# Patient Record
Sex: Female | Born: 1965 | Race: White | Hispanic: No | Marital: Married | Smoking: Never smoker
Health system: Southern US, Community
[De-identification: ages and names within clinical notes are randomized; demographics above are authoritative.]

## PROBLEM LIST (undated history)

## (undated) DIAGNOSIS — I10 Essential (primary) hypertension: Secondary | ICD-10-CM

## (undated) DIAGNOSIS — E876 Hypokalemia: Secondary | ICD-10-CM

## (undated) HISTORY — PX: NO PAST SURGERIES: SHX2092

---

## 2014-11-02 ENCOUNTER — Ambulatory Visit: Payer: Self-pay | Admitting: Family Medicine

## 2018-04-09 ENCOUNTER — Emergency Department: Payer: 59

## 2018-04-09 ENCOUNTER — Other Ambulatory Visit: Payer: Self-pay

## 2018-04-09 ENCOUNTER — Observation Stay
Admission: EM | Admit: 2018-04-09 | Discharge: 2018-04-10 | Disposition: A | Discharge status: Home / Self Care | Payer: 59 | Attending: Internal Medicine | Admitting: Internal Medicine

## 2018-04-09 ENCOUNTER — Emergency Department
Admit: 2018-04-09 | Discharge: 2018-04-09 | Disposition: A | Discharge status: Home / Self Care | Payer: 59 | Attending: Cardiovascular Disease | Admitting: Cardiovascular Disease

## 2018-04-09 DIAGNOSIS — R002 Palpitations: Secondary | ICD-10-CM | POA: Diagnosis not present

## 2018-04-09 DIAGNOSIS — E876 Hypokalemia: Secondary | ICD-10-CM | POA: Diagnosis not present

## 2018-04-09 DIAGNOSIS — R9431 Abnormal electrocardiogram [ECG] [EKG]: Secondary | ICD-10-CM | POA: Diagnosis not present

## 2018-04-09 DIAGNOSIS — I10 Essential (primary) hypertension: Secondary | ICD-10-CM | POA: Diagnosis not present

## 2018-04-09 DIAGNOSIS — Z791 Long term (current) use of non-steroidal anti-inflammatories (NSAID): Secondary | ICD-10-CM | POA: Insufficient documentation

## 2018-04-09 DIAGNOSIS — R079 Chest pain, unspecified: Principal | ICD-10-CM | POA: Insufficient documentation

## 2018-04-09 LAB — CBC
HCT: 34.3 % — ABNORMAL LOW (ref 35.0–47.0)
Hemoglobin: 12.1 g/dL (ref 12.0–16.0)
MCH: 33.6 pg (ref 26.0–34.0)
MCHC: 35.4 g/dL (ref 32.0–36.0)
MCV: 94.9 fL (ref 80.0–100.0)
Platelets: 338 10*3/uL (ref 150–440)
RBC: 3.62 MIL/uL — ABNORMAL LOW (ref 3.80–5.20)
RDW: 12.7 % (ref 11.5–14.5)
WBC: 7.7 10*3/uL (ref 3.6–11.0)

## 2018-04-09 LAB — COMPREHENSIVE METABOLIC PANEL
ALBUMIN: 4.4 g/dL (ref 3.5–5.0)
ALK PHOS: 99 U/L (ref 38–126)
ALT: 31 U/L (ref 0–44)
ANION GAP: 16 — AB (ref 5–15)
AST: 42 U/L — ABNORMAL HIGH (ref 15–41)
BILIRUBIN TOTAL: 1 mg/dL (ref 0.3–1.2)
BUN: 7 mg/dL (ref 6–20)
CALCIUM: 9.5 mg/dL (ref 8.9–10.3)
CO2: 17 mmol/L — ABNORMAL LOW (ref 22–32)
Chloride: 105 mmol/L (ref 98–111)
Creatinine, Ser: 0.71 mg/dL (ref 0.44–1.00)
GLUCOSE: 100 mg/dL — AB (ref 70–99)
Potassium: 2.5 mmol/L — CL (ref 3.5–5.1)
Sodium: 138 mmol/L (ref 135–145)
TOTAL PROTEIN: 7.5 g/dL (ref 6.5–8.1)

## 2018-04-09 LAB — ECHOCARDIOGRAM COMPLETE
Height: 68 in
WEIGHTICAEL: 2416 [oz_av]

## 2018-04-09 LAB — TROPONIN I: Troponin I: <0.03 ng/mL (ref <0.03)

## 2018-04-09 LAB — POCT PREGNANCY, URINE: PREG TEST UR: NEGATIVE

## 2018-04-09 MED ORDER — TRAZODONE HCL 50 MG PO TABS: 25.0000 mg | ORAL_TABLET | Freq: Every evening | ORAL | Status: DC | PRN

## 2018-04-09 MED ORDER — ASPIRIN EC 81 MG PO TBEC: 81.0000 mg | DELAYED_RELEASE_TABLET | Freq: Every day | ORAL | Status: DC

## 2018-04-09 MED ORDER — ONDANSETRON HCL 4 MG PO TABS: 4.0000 mg | ORAL_TABLET | Freq: Four times a day (QID) | ORAL | Status: DC | PRN

## 2018-04-09 MED ORDER — LORAZEPAM 2 MG/ML IJ SOLN
INTRAMUSCULAR | Status: AC
  Administered 2018-04-09: 0.5 mg via INTRAVENOUS
  Filled 2018-04-09: qty 1

## 2018-04-09 MED ORDER — SODIUM CHLORIDE 0.9 % IV SOLN
1000.0000 mL | Freq: Once | INTRAVENOUS | Status: AC
  Administered 2018-04-09: 1000 mL via INTRAVENOUS

## 2018-04-09 MED ORDER — ONDANSETRON HCL 4 MG/2ML IJ SOLN: 4.0000 mg | Freq: Four times a day (QID) | INTRAMUSCULAR | Status: DC | PRN

## 2018-04-09 MED ORDER — DOCUSATE SODIUM 100 MG PO CAPS
100.0000 mg | ORAL_CAPSULE | Freq: Two times a day (BID) | ORAL | Status: DC
  Filled 2018-04-09: qty 1

## 2018-04-09 MED ORDER — BISACODYL 5 MG PO TBEC: 5.0000 mg | DELAYED_RELEASE_TABLET | Freq: Every day | ORAL | Status: DC | PRN

## 2018-04-09 MED ORDER — ACETAMINOPHEN 325 MG PO TABS: 650.0000 mg | ORAL_TABLET | Freq: Four times a day (QID) | ORAL | Status: DC | PRN

## 2018-04-09 MED ORDER — SODIUM CHLORIDE 0.9 % IV SOLN: INTRAVENOUS | Status: DC

## 2018-04-09 MED ORDER — ACETAMINOPHEN 650 MG RE SUPP: 650.0000 mg | Freq: Four times a day (QID) | RECTAL | Status: DC | PRN

## 2018-04-09 MED ORDER — HEPARIN SODIUM (PORCINE) 5000 UNIT/ML IJ SOLN
5000.0000 [IU] | Freq: Three times a day (TID) | INTRAMUSCULAR | Status: DC
  Administered 2018-04-09 – 2018-04-10 (×2): 5000 [IU] via SUBCUTANEOUS
  Filled 2018-04-09 (×2): qty 1

## 2018-04-09 MED ORDER — CLONIDINE HCL 0.1 MG PO TABS
0.1000 mg | ORAL_TABLET | Freq: Four times a day (QID) | ORAL | Status: DC | PRN
  Administered 2018-04-09: 0.1 mg via ORAL
  Filled 2018-04-09: qty 1

## 2018-04-09 MED ORDER — HYDROCODONE-ACETAMINOPHEN 5-325 MG PO TABS
1.0000 | ORAL_TABLET | ORAL | Status: DC | PRN
  Administered 2018-04-10: 1 via ORAL
  Filled 2018-04-09: qty 1

## 2018-04-09 MED ORDER — POTASSIUM CHLORIDE IN NACL 40-0.9 MEQ/L-% IV SOLN
INTRAVENOUS | Status: DC
  Administered 2018-04-09: 75 mL/h via INTRAVENOUS
  Filled 2018-04-09 (×3): qty 1000

## 2018-04-09 MED ORDER — POTASSIUM CHLORIDE 10 MEQ/100ML IV SOLN
10.0000 meq | INTRAVENOUS | Status: DC
  Administered 2018-04-09: 10 meq via INTRAVENOUS
  Filled 2018-04-09 (×4): qty 100

## 2018-04-09 MED ORDER — LORAZEPAM 2 MG/ML IJ SOLN
0.5000 mg | Freq: Once | INTRAMUSCULAR | Status: AC
  Administered 2018-04-09: 0.5 mg via INTRAVENOUS

## 2018-04-09 MED ORDER — LORAZEPAM 2 MG/ML IJ SOLN: 0.5000 mg | Freq: Four times a day (QID) | INTRAMUSCULAR | Status: DC | PRN

## 2018-04-09 NOTE — ED Notes (Signed)
Pt states she is experiencing numbness and tingling in her arms and hand. Pt currently had increased breathing rate and instructed to slow breathing and take long deep breaths.

## 2018-04-09 NOTE — ED Provider Notes (Signed)
[facility] Emergency Department Provider Note   ____________________________________________    I have reviewed the triage vital signs and the nursing notes.   HISTORY  Chief Complaint Chest Pain     HPI Carmen Salazar is a 52 y.o. female who presents with complaints of chest pain.  Patient reports chest pain started approximate 1 hour ago.  She describes it as a mild pressure-like pain, she feels quite anxious as well.  Denies radiation of pain.  She has not taken anything for this.  Does admit to using cocaine last night.  No fevers or chills or cough.  No calf pain or swelling.   No past medical history on file.  There are no active problems to display for this patient.     Prior to Admission medications   Medication Sig Start Date End Date Taking? Authorizing Provider  ibuprofen (ADVIL,MOTRIN) 200 MG tablet Take 200-400 mg by mouth every 6 (six) hours as needed for headache or moderate pain.   Yes [provider]     Allergies Patient has no known allergies.  No family history on file.  Social History Cocaine use last night Smoke cigarettes  Review of Systems  Constitutional: No fever/chills Eyes: No visual changes.  ENT: No sore throat. Cardiovascular: As above Respiratory: Denies shortness of breath. Gastrointestinal: No abdominal pain.  No nausea, no vomiting.   Genitourinary: Negative for dysuria. Musculoskeletal: Negative for back pain. Skin: Negative for rash. Neurological: Negative for headaches   ____________________________________________   PHYSICAL EXAM:  VITAL SIGNS: ED Triage Vitals [04/09/18 1733]  Enc Vitals Group     BP      Pulse      Resp      Temp 97.8 F (36.6 C)     Temp Source Oral     SpO2      Weight 68.5 kg (151 lb)     Height 1.727 m (5\' 8" )     Head Circumference      Peak Flow      Pain Score 10     Pain Loc      Pain Edu?      Excl. in GC?      Constitutional: Alert and oriented.  Eyes: Conjunctivae are normal.   Nose: No congestion/rhinnorhea. Mouth/Throat: Mucous membranes are moist.   Neck:  Painless ROM Cardiovascular: Tachycardia, regular rhythm. Grossly normal heart sounds.  Good peripheral circulation. Respiratory: Normal respiratory effort.  No retractions. Lungs CTAB. Gastrointestinal: Soft and nontender. No distention.  No CVA tenderness. Genitourinary: deferred Musculoskeletal: No lower extremity tenderness nor edema.  Warm and well perfused Neurologic:  Normal speech and language. No gross focal neurologic deficits are appreciated.  Skin:  Skin is warm, dry and intact. No rash noted. Psychiatric: Mood and affect are normal. Speech and behavior are normal.  ____________________________________________   LABS (all labs ordered are listed, but only abnormal results are displayed)  Labs Reviewed  CBC - Abnormal; Notable for the following components:      Result Value   RBC 3.62 (*)    HCT 34.3 (*)    All other components within normal limits  COMPREHENSIVE METABOLIC PANEL - Abnormal; Notable for the following components:   Potassium 2.5 (*)    CO2 17 (*)    Glucose, Bld 100 (*)    AST 42 (*)    Anion gap 16 (*)    All other components within normal limits  TROPONIN I  POC URINE  PREG, ED   ____________________________________________  EKG  ED ECG REPORT I, Jene Everyobert Thadius Smisek, the attending physician, personally viewed and interpreted this ECG.  Date: 04/09/2018 EKG Time: 5:35 PM Rate: 119 Rhythm: Sinus tachycardia QRS Axis: normal Intervals: normal ST/T Wave abnormalities:  Narrative Interpretation: ST elevation in aVR mild ST depression laterally and in 1 and 2 concerning for ischemia but does not meet STEMI criteria, will discuss with cardiology  ____________________________________________  RADIOLOGY  Chest x-ray  unremarkable ____________________________________________   PROCEDURES  Procedure(s) performed: No  Procedures   Critical Care performed: No ____________________________________________   INITIAL IMPRESSION / ASSESSMENT AND PLAN / ED COURSE  Pertinent labs & imaging results that were available during my care of the patient were reviewed by me and considered in my medical decision making (see chart for details).  ----------------------------------------- 5:51 PM on 04/09/2018 -----------------------------------------  Patient presents with chest pain, cocaine use last night.  EKG concerning for ischemia but does not meet STEMI criteria, will discuss with on-call cardiology  Dr. Welton FlakesKhan reports he will see the patient in the emergency department   Patient's troponin is normal, Dr. Welton FlakesKhan is ordering an echo and recommends admission to the hospital    ____________________________________________   FINAL CLINICAL IMPRESSION(S) / ED DIAGNOSES  Final diagnoses:  Chest pain, unspecified type        Note:  This document was prepared using Dragon voice recognition software and may include unintentional dictation errors.    Jene EveryKinner, Paco Cislo, MD 04/09/18 [phone]

## 2018-04-09 NOTE — ED Notes (Signed)
Pt drinking water, awaiting admission.

## 2018-04-09 NOTE — ED Notes (Signed)
Pt up to bathroom, ua sent to lab. Still co slight palpitations and some dizziness, denies any cp.

## 2018-04-09 NOTE — ED Notes (Signed)
Date and time results received: 04/09/18  Test: pot Critical Value: 2.5  Name of Provider Notified: kinner  Orders Received? Or Actions Taken?: MD notified

## 2018-04-09 NOTE — H&P (Signed)
[facility] [place] at [facility]   PATIENT NAME: Carmen Salazar    MR#:  [id number]  DATE OF BIRTH:  May 11, 1966  DATE OF ADMISSION:  04/09/2018  PRIMARY CARE PHYSICIAN: Leim Fabry, MD   REQUESTING/REFERRING PHYSICIAN:   CHIEF COMPLAINT:   Chief Complaint  Patient presents with  . Chest Pain    HISTORY OF PRESENT ILLNESS: Carmen Salazar  is a 52 y.o. female without significant past medical history. Patient presented to emergency room for acute onset of retrosternal chest pain associated with palpitations and anxiety. She describes the chest pain as a constant 6 out of 10 pressure, without radiation, started approximately 1 hour before arrival to emergency room.  No fever or chills, no cough, no N/V/D, no bleeding.  No new medications.  Patient does admit to using cocaine and alcohol last night. At the arrival to emergency room, patient was noted with high blood pressure and tachycardia.  EKG showed sinus tachycardia with a heart rate 119.  There is ST elevation in aVR and mild ST depression laterally in V1 and V2.  Troponin level is negative.  Potassium level is low at 2.5.  Chest x-ray is unremarkable. Patient is admitted as observation, to rule out ACS.  PAST MEDICAL HISTORY: Headaches.  PAST SURGICAL HISTORY: No surgeries.  SOCIAL HISTORY:  No tobacco use, but patient admits to using alcohol and cocaine most recent, last night.  FAMILY HISTORY: HTN in mother.  DRUG ALLERGIES: No Known Allergies  REVIEW OF SYSTEMS:   CONSTITUTIONAL: No fever, fatigue or weakness.  EYES: No blurred or double vision.  EARS, NOSE, AND THROAT: No tinnitus or ear pain.  RESPIRATORY: No cough, shortness of breath, wheezing or hemoptysis.  CARDIOVASCULAR: Positive for chest pain and palpitations.  GASTROINTESTINAL: No nausea, vomiting, diarrhea or abdominal pain.  GENITOURINARY: No dysuria, hematuria.  ENDOCRINE: No polyuria, nocturia,  HEMATOLOGY: No anemia,  easy bruising or bleeding SKIN: No rash or lesion. MUSCULOSKELETAL: No joint pain or arthritis.   NEUROLOGIC: No tingling, numbness, weakness.  PSYCHIATRY: No anxiety or depression.   MEDICATIONS AT HOME:  Prior to Admission medications   Medication Sig Start Date End Date Taking? Authorizing Provider  ibuprofen (ADVIL,MOTRIN) 200 MG tablet Take 200-400 mg by mouth every 6 (six) hours as needed for headache or moderate pain.   Yes [provider]      PHYSICAL EXAMINATION:   VITAL SIGNS: Blood pressure (!) 162/93, pulse 98, temperature 97.8 F (36.6 C), temperature source Oral, resp. rate 17, height 5\' 8"  (1.727 m), weight 68.5 kg (151 lb), SpO2 98 %.  GENERAL:  52 y.o.-year-old patient lying in the bed with no acute distress.  EYES: Pupils equal, round, reactive to light and accommodation. No scleral icterus. Extraocular muscles intact.  HEENT: Head atraumatic, normocephalic. Oropharynx and nasopharynx clear.  NECK:  Supple, no jugular venous distention. No thyroid enlargement, no tenderness.  LUNGS: Normal breath sounds bilaterally, no wheezing, rales,rhonchi or crepitation. No use of accessory muscles of respiration.  CARDIOVASCULAR: Positive for sinus tachycardia.  S1, S2 normal.  No S3/S4.  ABDOMEN: Soft, nontender, nondistended. Bowel sounds present. No organomegaly or mass.  EXTREMITIES: No pedal edema, cyanosis, or clubbing.  NEUROLOGIC: Cranial nerves II through XII are intact. Muscle strength 5/5 in all extremities. Sensation intact. Gait is stable.   PSYCHIATRIC: The patient is alert and oriented x 3.  SKIN: No obvious rash, lesion, or ulcer.   LABORATORY PANEL:   CBC Recent Labs  Lab 04/09/18 1736  WBC 7.7  HGB 12.1  HCT 34.3*  PLT 338  MCV 94.9  MCH 33.6  MCHC 35.4  RDW 12.7   ------------------------------------------------------------------------------------------------------------------  Chemistries  Recent Labs  Lab 04/09/18 1736  NA 138   K 2.5*  CL 105  CO2 17*  GLUCOSE 100*  BUN 7  CREATININE 0.71  CALCIUM 9.5  AST 42*  ALT 31  ALKPHOS 99  BILITOT 1.0   ------------------------------------------------------------------------------------------------------------------ estimated creatinine clearance is 83 mL/min (by C-G formula based on SCr of 0.71 mg/dL). ------------------------------------------------------------------------------------------------------------------ No results for input(s): TSH, T4TOTAL, T3FREE, THYROIDAB in the last 72 hours.  Invalid input(s): FREET3   Coagulation profile No results for input(s): INR, PROTIME in the last 168 hours. ------------------------------------------------------------------------------------------------------------------- No results for input(s): DDIMER in the last 72 hours. -------------------------------------------------------------------------------------------------------------------  Cardiac Enzymes Recent Labs  Lab 04/09/18 1736  TROPONINI <0.03   ------------------------------------------------------------------------------------------------------------------ Invalid input(s): POCBNP  ---------------------------------------------------------------------------------------------------------------  Urinalysis No results found for: COLORURINE, APPEARANCEUR, LABSPEC, PHURINE, GLUCOSEU, HGBUR, BILIRUBINUR, KETONESUR, PROTEINUR, UROBILINOGEN, NITRITE, LEUKOCYTESUR   RADIOLOGY: Dg Chest Port 1 View  Result Date: 04/09/2018 CLINICAL DATA:  Chest pain EXAM: PORTABLE CHEST 1 VIEW COMPARISON:  None. FINDINGS: The heart size and mediastinal contours are within normal limits. Both lungs are clear. Mild scoliosis of the spine. IMPRESSION: No active disease. Electronically Signed   By: Jasmine PangKim  Fujinaga M.D.   On: 04/09/2018 18:00    EKG: Orders placed or performed during the hospital encounter of 04/09/18  . ED EKG within 10 minutes  . EKG 12-Lead  . EKG 12-Lead  .  EKG 12-Lead  . EKG 12-Lead  . ED EKG within 10 minutes    IMPRESSION AND PLAN:  1.  Chest pain, associated with palpitations and EKG abnormalities, likely secondary to cocaine use.  Will rule out ACS. Will check 2D echo and repeat troponin level.  Continue to monitor on telemetry.  Cardiology is consulted for further evaluation and treatment. 2.  Hypertension, likely secondary to cocaine use.  Will treat with clonidine as needed. 3.  Alcohol and cocaine use. ETOH and cocaine cessation was discussed with patient in detail. 4.  Hypokalemia.  Will replace potassium per protocol.    All the records are reviewed and case discussed with ED provider. Management plans discussed with the patient, family and they are in agreement.  CODE STATUS: Full   TOTAL TIME TAKING CARE OF THIS PATIENT: 45 minutes.    Cammy CopaAngela Zykiria Bruening M.D on 04/09/2018 at 9:53 PM  Between 7am to 6pm - Pager - [phone]  After 6pm go to [email or web address] - password [email or web address] [email or web address]  [facility]  Office  [phone]  CC: Primary care physician; Leim FabryAldridge, Barbara, MD

## 2018-04-09 NOTE — Consult Note (Signed)
Carmen Salazar is a 52 y.o. female  [id number]  Primary Cardiologist: Adrian Blackwatershaukat Neal Trulson Reason for Consultation: chest pain  HPI: [id number] came in by EMS with severe left sided chest pain as radiating to arm and back associated with palpitation and SOB.   Review of Systems: NO syncope   No past medical history on file.   (Not in a hospital admission)     Infusions:   No Known Allergies  Social History   Socioeconomic History  . Marital status: Married    Spouse name: Not on file  . Number of children: Not on file  . Years of education: Not on file  . Highest education level: Not on file  Occupational History  . Not on file  Social Needs  . Financial resource strain: Not on file  . Food insecurity:    Worry: Not on file    Inability: Not on file  . Transportation needs:    Medical: Not on file    Non-medical: Not on file  Tobacco Use  . Smoking status: Not on file  Substance and Sexual Activity  . Alcohol use: Not on file  . Drug use: Not on file  . Sexual activity: Not on file  Lifestyle  . Physical activity:    Days per week: Not on file    Minutes per session: Not on file  . Stress: Not on file  Relationships  . Social connections:    Talks on phone: Not on file    Gets together: Not on file    Attends religious service: Not on file    Active member of club or organization: Not on file    Attends meetings of clubs or organizations: Not on file    Relationship status: Not on file  . Intimate partner violence:    Fear of current or ex partner: Not on file    Emotionally abused: Not on file    Physically abused: Not on file    Forced sexual activity: Not on file  Other Topics Concern  . Not on file  Social History Narrative  . Not on file    No family history on file.  PHYSICAL EXAM: Vitals:   04/09/18 1830 04/09/18 1900  BP: (!) 154/93 (!) 155/92  Pulse: (!) 101 (!) 102  Resp: 19 (!) 21  Temp:    SpO2: 100% 99%    No intake  or output data in the 24 hours ending 04/09/18 1938  General:  Well appearing. No respiratory difficulty HEENT: normal Neck: supple. no JVD. Carotids 2+ bilat; no bruits. No lymphadenopathy or thryomegaly appreciated. Cor: PMI nondisplaced. Regular rate & rhythm. No rubs, gallops or murmurs. Lungs: clear Abdomen: soft, nontender, nondistended. No hepatosplenomegaly. No bruits or masses. Good bowel sounds. Extremities: no cyanosis, clubbing, rash, edema Neuro: alert & oriented x 3, cranial nerves grossly intact. moves all 4 extremities w/o difficulty. Affect pleasant.  ECG: sinus tachycardia with inferolateral st depressions  Results for orders placed or performed during the hospital encounter of 04/09/18 (from the past 24 hour(s))  CBC     Status: Abnormal   Collection Time: 04/09/18  5:36 PM  Result Value Ref Range   WBC 7.7 3.6 - 11.0 K/uL   RBC 3.62 (L) 3.80 - 5.20 MIL/uL   Hemoglobin 12.1 12.0 - 16.0 g/dL   HCT 40.934.3 (L) 81.135.0 - 91.447.0 %   MCV 94.9 80.0 - 100.0 fL   MCH 33.6 26.0 - 34.0 pg  MCHC 35.4 32.0 - 36.0 g/dL   RDW 30.8 65.7 - 84.6 %   Platelets 338 150 - 440 K/uL  Troponin I     Status: None   Collection Time: 04/09/18  5:36 PM  Result Value Ref Range   Troponin I <0.03 <0.03 ng/mL  Comprehensive metabolic panel     Status: Abnormal   Collection Time: 04/09/18  5:36 PM  Result Value Ref Range   Sodium 138 135 - 145 mmol/L   Potassium 2.5 (LL) 3.5 - 5.1 mmol/L   Chloride 105 98 - 111 mmol/L   CO2 17 (L) 22 - 32 mmol/L   Glucose, Bld 100 (H) 70 - 99 mg/dL   BUN 7 6 - 20 mg/dL   Creatinine, Ser 9.62 0.44 - 1.00 mg/dL   Calcium 9.5 8.9 - 95.2 mg/dL   Total Protein 7.5 6.5 - 8.1 g/dL   Albumin 4.4 3.5 - 5.0 g/dL   AST 42 (H) 15 - 41 U/L   ALT 31 0 - 44 U/L   Alkaline Phosphatase 99 38 - 126 U/L   Total Bilirubin 1.0 0.3 - 1.2 mg/dL   GFR calc non Af Amer >60 >60 mL/min   GFR calc Af Amer >60 >60 mL/min   Anion gap 16 (H) 5 - 15   Dg Chest Port 1 View  Result  Date: 04/09/2018 CLINICAL DATA:  Chest pain EXAM: PORTABLE CHEST 1 VIEW COMPARISON:  None. FINDINGS: The heart size and mediastinal contours are within normal limits. Both lungs are clear. Mild scoliosis of the spine. IMPRESSION: No active disease. Electronically Signed   By: Jasmine Pang M.D.   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 Has also Hypokalemia, and advise IV 40 meq over 4 hours. Also advise nitrates topically, asp, heparin and sedatives. Advise cardizem for tachycardia as metoprolol not ideal for coronary spasms. May need echo to look at wall motion.  Darya Bigler A

## 2018-04-09 NOTE — Progress Notes (Addendum)
Pharmacy Electrolyte Monitoring Consult:  Pharmacy consulted to assist in monitoring and replacing electrolytes in this 52 y.o. female admitted on 04/09/2018 with Chest Pain   Labs:  Sodium (mmol/L)  Date Value  04/09/2018 138   Potassium (mmol/L)  Date Value  04/09/2018 2.5 (LL)   Calcium (mg/dL)  Date Value  84/13/244007/12/2017 9.5   Albumin (g/dL)  Date Value  10/27/253607/12/2017 4.4    Assessment/Plan: Patient admitted for CP w/ trop NG, EKG sinus tach. On admission labs K+ 2.5 Will replace via fluids NS + 40 mEq IV @ 75 ml/hr in addition to KCI 10 mEq IV x 4. Should theoretical increase K+ to 3.6 after 24 hours. Will monitor w/ am labs, mag pending  Patient's IV is burning, will discontinue K runs and switch to PO 40 mEq bid for 3 doses and continue fluids.  Thomasene Rippleavid Zykerria Tanton, PharmD, BCPS Clinical Pharmacist 04/09/2018

## 2018-04-09 NOTE — ED Triage Notes (Signed)
Pt presents today via [facility] from home around 445 walking daog felt palputations and pressure in chest. PT complains of shob 145 HR vagal down to 125 NKA use cocaine last night and drank alcohol. [id number]-124 97.8 oral temp 147/87 HR 121 99 2L

## 2018-04-09 NOTE — Progress Notes (Signed)
*  PRELIMINARY RESULTS* Echocardiogram 2D Echocardiogram has been performed.  Garrel Ridgelikeshia S Florinda Taflinger 04/09/2018, 8:01 PM

## 2018-04-09 NOTE — ED Notes (Signed)
Pt admitted to 233, report called to RN

## 2018-04-09 NOTE — ED Notes (Signed)
Pt resting comfortably with family at bedside. Pt denies any pain at this time still co slight feeling of racing heart rate. IV x 2 noted with no redness or swelling noted. Pt awaiting disposition, call bell at bedside.

## 2018-04-10 ENCOUNTER — Other Ambulatory Visit: Payer: Self-pay

## 2018-04-10 LAB — URINE DRUG SCREEN, QUALITATIVE (ARMC ONLY)
AMPHETAMINES, UR SCREEN: POSITIVE — AB
Benzodiazepine, Ur Scrn: POSITIVE — AB
COCAINE METABOLITE, UR ~~LOC~~: POSITIVE — AB
Cannabinoid 50 Ng, Ur ~~LOC~~: NOT DETECTED
MDMA (Ecstasy)Ur Screen: NOT DETECTED
METHADONE SCREEN, URINE: NOT DETECTED
OPIATE, UR SCREEN: NOT DETECTED
Phencyclidine (PCP) Ur S: NOT DETECTED
TRICYCLIC, UR SCREEN: NOT DETECTED

## 2018-04-10 LAB — CBC
HEMATOCRIT: 33.8 % — AB (ref 35.0–47.0)
HEMOGLOBIN: 11.8 g/dL — AB (ref 12.0–16.0)
MCH: 33.5 pg (ref 26.0–34.0)
MCHC: 34.9 g/dL (ref 32.0–36.0)
MCV: 95.9 fL (ref 80.0–100.0)
Platelets: 293 10*3/uL (ref 150–440)
RBC: 3.53 MIL/uL — AB (ref 3.80–5.20)
RDW: 12.7 % (ref 11.5–14.5)
WBC: 6 10*3/uL (ref 3.6–11.0)

## 2018-04-10 LAB — BASIC METABOLIC PANEL
ANION GAP: 7 (ref 5–15)
BUN: 5 mg/dL — ABNORMAL LOW (ref 6–20)
CO2: 24 mmol/L (ref 22–32)
Calcium: 8.9 mg/dL (ref 8.9–10.3)
Chloride: 111 mmol/L (ref 98–111)
Creatinine, Ser: 0.54 mg/dL (ref 0.44–1.00)
GFR calc Af Amer: >60 mL/min (ref >60)
GFR calc non Af Amer: >60 mL/min (ref >60)
GLUCOSE: 84 mg/dL (ref 70–99)
POTASSIUM: 4.3 mmol/L (ref 3.5–5.1)
Sodium: 142 mmol/L (ref 135–145)

## 2018-04-10 LAB — MAGNESIUM: Magnesium: 1.7 mg/dL (ref 1.7–2.4)

## 2018-04-10 LAB — GLUCOSE, CAPILLARY: GLUCOSE-CAPILLARY: 78 mg/dL (ref 70–99)

## 2018-04-10 LAB — TROPONIN I: Troponin I: <0.03 ng/mL (ref <0.03)

## 2018-04-10 MED ORDER — POTASSIUM CHLORIDE CRYS ER 20 MEQ PO TBCR
40.0000 meq | EXTENDED_RELEASE_TABLET | Freq: Two times a day (BID) | ORAL | Status: DC
  Administered 2018-04-10: 40 meq via ORAL
  Filled 2018-04-10: qty 2

## 2018-04-10 MED ORDER — ISOSORBIDE MONONITRATE ER 30 MG PO TB24: 30.0000 mg | ORAL_TABLET | Freq: Every day | ORAL | 0 refills | Status: DC

## 2018-04-10 MED ORDER — ASPIRIN 81 MG PO TBEC: 81.0000 mg | DELAYED_RELEASE_TABLET | Freq: Every day | ORAL | 0 refills | Status: DC

## 2018-04-10 MED ORDER — SODIUM CHLORIDE 0.9% FLUSH: 3.0000 mL | Freq: Two times a day (BID) | INTRAVENOUS | Status: DC

## 2018-04-10 MED ORDER — ISOSORBIDE MONONITRATE ER 30 MG PO TB24: 30.0000 mg | ORAL_TABLET | Freq: Every day | ORAL | Status: DC

## 2018-04-10 MED ORDER — MAGNESIUM OXIDE -MG SUPPLEMENT 200 MG PO TABS: 200.0000 mg | ORAL_TABLET | Freq: Every morning | ORAL | 0 refills | Status: AC

## 2018-04-10 MED ORDER — MAGNESIUM SULFATE 2 GM/50ML IV SOLN
2.0000 g | INTRAVENOUS | Status: AC
  Administered 2018-04-10: 2 g via INTRAVENOUS
  Filled 2018-04-10: qty 50

## 2018-04-10 NOTE — Plan of Care (Signed)
  Problem: Education: Goal: Knowledge of General Education information will improve Outcome: Progressing   Problem: Health Behavior/Discharge Planning: Goal: Ability to manage health-related needs will improve Outcome: Progressing   Problem: Pain Managment: Goal: General experience of comfort will improve Outcome: Progressing   

## 2018-04-10 NOTE — Discharge Summary (Signed)
[facility] - [place] at [facility]   PATIENT NAME: Carmen Salazar    MR#:  [id number]  DATE OF BIRTH:  [birth year]  DATE OF ADMISSION:  04/09/2018 ADMITTING PHYSICIAN: Cammy Copa, MD  DATE OF DISCHARGE: 04/10/2018 11:56 AM  PRIMARY CARE PHYSICIAN: Leim Fabry, MD    ADMISSION DIAGNOSIS:  Chest pain, unspecified type [R07.9]  DISCHARGE DIAGNOSIS:  Active Problems:   Chest pain   SECONDARY DIAGNOSIS:  No past medical history on file.  HOSPITAL COURSE:   1.  Chest pain and palpitations.  Patient used cocaine on Tuesday.  This is likely coronary vasospasm secondary to cocaine use.  Patient was seen in consultation by Dr. Park Breed cardiology and he will follow-up with the patient as outpatient on Monday. He recommended aspirin, Imdur.   2.  Hypokalemia and hypomagnesemia.  Replace magnesium oral and IV.  Replace potassium orally and IV.  On discharge her potassium is in the normal range.  We will continue magnesium supplementation  orally as outpatient. 3.  Cocaine use.  Patient was advised to stop using cocaine because it can cause coronary vasospasm and sudden death. 4.  Alcohol use.  Patient advised to cut back on alcohol secondary to electrolyte abnormalities. 5.  Elevated blood pressure likely secondary to cocaine.  Continue to monitor as outpatient.   DISCHARGE CONDITIONS:   Satisfactory  CONSULTS OBTAINED:  Treatment Team:  Laurier Nancy, MD  DRUG ALLERGIES:  No Known Allergies  DISCHARGE MEDICATIONS:   Allergies as of 04/10/2018   No Known Allergies     Medication List    STOP taking these medications   ibuprofen 200 MG tablet Commonly known as:  ADVIL,MOTRIN     TAKE these medications   aspirin 81 MG EC tablet Take 1 tablet (81 mg total) by mouth daily.   isosorbide mononitrate 30 MG 24 hr tablet Commonly known as:  IMDUR Take 1 tablet (30 mg total) by mouth daily.   Magnesium Oxide 200 MG Tabs Commonly known as:  MAG-OXIDE Take  1 tablet (200 mg total) by mouth every morning.        DISCHARGE INSTRUCTIONS:    Follow-up with Dr. Park Breed cardiology on Monday Follow-up with PMD 6 days  If you experience worsening of your admission symptoms, develop shortness of breath, life threatening emergency, suicidal or homicidal thoughts you must seek medical attention immediately by calling 911 or calling your MD immediately  if symptoms less severe.  You Must read complete instructions/literature along with all the possible adverse reactions/side effects for all the Medicines you take and that have been prescribed to you. Take any new Medicines after you have completely understood and accept all the possible adverse reactions/side effects.   Please note  You were cared for by a hospitalist during your hospital stay. If you have any questions about your discharge medications or the care you received while you were in the hospital after you are discharged, you can call the unit and asked to speak with the hospitalist on call if the hospitalist that took care of you is not available. Once you are discharged, your primary care physician will handle any further medical issues. Please note that NO REFILLS for any discharge medications will be authorized once you are discharged, as it is imperative that you return to your primary care physician (or establish a relationship with a primary care physician if you do not have one) for your aftercare needs so that they can reassess your need for medications  and monitor your lab values.    Today   CHIEF COMPLAINT:   Chief Complaint  Patient presents with  . Chest Pain    HISTORY OF PRESENT ILLNESS:  Carmen Salazar  is a 52 y.o. female  presented with chest pain   VITAL SIGNS:  Blood pressure (!) 143/87, pulse 73, temperature 98.5 F (36.9 C), temperature source Oral, resp. rate 14, height 5\' 8"  (1.727 m), weight 64.1 kg (141 lb 4.8 oz), SpO2 100 %.   PHYSICAL EXAMINATION:   GENERAL:  52 y.o.-year-old patient lying in the bed with no acute distress.  EYES: Pupils equal, round, reactive to light and accommodation. No scleral icterus. Extraocular muscles intact.  HEENT: Head atraumatic, normocephalic. Oropharynx and nasopharynx clear.  NECK:  Supple, no jugular venous distention. No thyroid enlargement, no tenderness.  LUNGS: Normal breath sounds bilaterally, no wheezing, rales,rhonchi or crepitation. No use of accessory muscles of respiration.  CARDIOVASCULAR: S1, S2 normal. No murmurs, rubs, or gallops.  ABDOMEN: Soft, non-tender, non-distended. Bowel sounds present. No organomegaly or mass.  EXTREMITIES: No pedal edema, cyanosis, or clubbing.  NEUROLOGIC: Cranial nerves II through XII are intact. Muscle strength 5/5 in all extremities. Sensation intact. Gait not checked.  PSYCHIATRIC: The patient is alert and oriented x 3.  SKIN: No obvious rash, lesion, or ulcer.   DATA REVIEW:   CBC Recent Labs  Lab 04/10/18 0536  WBC 6.0  HGB 11.8*  HCT 33.8*  PLT 293    Chemistries  Recent Labs  Lab 04/09/18 1736 04/09/18 2338 04/10/18 0536  NA 138  --  142  K 2.5*  --  4.3  CL 105  --  111  CO2 17*  --  24  GLUCOSE 100*  --  84  BUN 7  --  5*  CREATININE 0.71  --  0.54  CALCIUM 9.5  --  8.9  MG  --  1.7  --   AST 42*  --   --   ALT 31  --   --   ALKPHOS 99  --   --   BILITOT 1.0  --   --     Cardiac Enzymes Recent Labs  Lab 04/09/18 2338  TROPONINI <0.03      RADIOLOGY:  Dg Chest Port 1 View  Result Date: 04/09/2018 CLINICAL DATA:  Chest pain EXAM: PORTABLE CHEST 1 VIEW COMPARISON:  None. FINDINGS: The heart size and mediastinal contours are within normal limits. Both lungs are clear. Mild scoliosis of the spine. IMPRESSION: No active disease. Electronically Signed   By: Jasmine PangKim  Fujinaga M.D.   On: 04/09/2018 18:00     Management plans discussed with the patient, family and they are in agreement.  CODE STATUS:     Code Status Orders   (From admission, onward)        Start     Ordered   04/09/18 2304  Full code  Continuous     04/09/18 2303    Code Status History    This patient has a current code status but no historical code status.      TOTAL TIME TAKING CARE OF THIS PATIENT: 35 minutes.    Alford Highlandichard Shuronda Santino M.D on 04/10/2018 at 12:33 PM  Between 7am to 6pm - Pager - [phone]  After 6pm go to [email or web address] - password [email or web address]  [facility] Office  [phone]  CC: Primary care physician; Leim FabryAldridge, Barbara, MD

## 2018-04-10 NOTE — Discharge Instructions (Signed)
Aspirin and Your Heart Aspirin is a medicine that affects the way blood clots. Aspirin can be used to help reduce the risk of blood clots, heart attacks, and other heart-related problems. Should I take aspirin? Your health care provider will help you determine whether it is safe and beneficial for you to take aspirin daily. Taking aspirin daily may be beneficial if you:  Have had a heart attack or chest pain.  Have undergone open heart surgery such as coronary artery bypass surgery (CABG).  Have had coronary angioplasty.  Have experienced a stroke or transient ischemic attack (TIA).  Have peripheral vascular disease (PVD).  Have chronic heart rhythm problems such as atrial fibrillation.  Are there any risks of taking aspirin daily? Daily use of aspirin can increase your risk of side effects. Some of these include:  Bleeding. Bleeding problems can be minor or serious. An example of a minor problem is a cut that does not stop bleeding. An example of a more serious problem is stomach bleeding or bleeding into the brain. Your risk of bleeding is increased if you are also taking non-steroidal anti-inflammatory medicine (NSAIDs).  Increased bruising.  Upset stomach.  An allergic reaction. People who have nasal polyps have an increased risk of developing an aspirin allergy.  What are some guidelines I should follow when taking aspirin?  Take aspirin only as directed by your health care provider. Make sure you understand how much you should take and what form you should take. The two forms of aspirin are: ? Non-enteric-coated. This type of aspirin does not have a coating and is absorbed quickly. Non-enteric-coated aspirin is usually recommended for people with chest pain. This type of aspirin also comes in a chewable form. ? Enteric-coated. This type of aspirin has a special coating that releases the medicine very slowly. Enteric-coated aspirin causes less stomach upset than  non-enteric-coated aspirin. This type of aspirin should not be chewed or crushed.  Drink alcohol in moderation. Drinking alcohol increases your risk of bleeding. When should I seek medical care?  You have unusual bleeding or bruising.  You have stomach pain.  You have an allergic reaction. Symptoms of an allergic reaction include: ? Hives. ? Itchy skin. ? Swelling of the lips, tongue, or face.  You have ringing in your ears. When should I seek immediate medical care?  Your bowel movements are bloody, dark red, or black in color.  You vomit or cough up blood.  You have blood in your urine.  You cough, wheeze, or feel short of breath. If you have any of the following symptoms, this is an emergency. Do not wait to see if the pain will go away. Get medical help at once. Call your local emergency services (911 in the [place] Do not drive yourself to the hospital.  You have severe chest pain, especially if the pain is crushing or pressure-like and spreads to the arms, back, neck, or jaw.  You have stroke-like symptoms, such as: ? Loss of vision. ? Difficulty talking. ? Numbness or weakness on one side of your body. ? Numbness or weakness in your arm or leg. ? Not thinking clearly or feeling confused.  This information is not intended to replace advice given to you by your health care provider. Make sure you discuss any questions you have with your health care provider. Document Released: 09/06/2008 Document Revised: 02/01/2016 Document Reviewed: 12/30/2013 Elsevier Interactive Patient Education  2018 Elsevier Inc. Isosorbide Mononitrate extended-release tablets What is this medicine? ISOSORBIDE MONONITRATE (  eye soe SOR bide mon oh NYE trate) is a vasodilator. It relaxes blood vessels, increasing the blood and oxygen supply to your heart. This medicine is used to prevent chest pain caused by angina. It will not help to stop an episode of chest pain. This medicine may be used for  other purposes; ask your health care provider or pharmacist if you have questions. COMMON BRAND NAME(S): Imdur, Isotrate ER What should I tell my health care provider before I take this medicine? They need to know if you have any of these conditions: -previous heart attack or heart failure -an unusual or allergic reaction to isosorbide mononitrate, nitrates, other medicines, foods, dyes, or preservatives -pregnant or trying to get pregnant -breast-feeding How should I use this medicine? Take this medicine by mouth with a glass of water. Follow the directions on the prescription label. Do not crush or chew. Take your medicine at regular intervals. Do not take your medicine more often than directed. Do not stop taking this medicine except on the advice of your doctor or health care professional. Talk to your pediatrician regarding the use of this medicine in children. Special care may be needed. Overdosage: If you think you have taken too much of this medicine contact a poison control center or emergency room at once. NOTE: This medicine is only for you. Do not share this medicine with others. What if I miss a dose? If you miss a dose, take it as soon as you can. If it is almost time for your next dose, take only that dose. Do not take double or extra doses. What may interact with this medicine? Do not take this medicine with any of the following medications: -medicines used to treat erectile dysfunction (ED) like avanafil, sildenafil, tadalafil, and vardenafil -riociguat This medicine may also interact with the following medications: -medicines for high blood pressure -other medicines for angina or heart failure This list may not describe all possible interactions. Give your health care provider a list of all the medicines, herbs, non-prescription drugs, or dietary supplements you use. Also tell them if you smoke, drink alcohol, or use illegal drugs. Some items may interact with your  medicine. What should I watch for while using this medicine? Check your heart rate and blood pressure regularly while you are taking this medicine. Ask your doctor or health care professional what your heart rate and blood pressure should be and when you should contact him or her. Tell your doctor or health care professional if you feel your medicine is no longer working. You may get dizzy. Do not drive, use machinery, or do anything that needs mental alertness until you know how this medicine affects you. To reduce the risk of dizzy or fainting spells, do not sit or stand up quickly, especially if you are an older patient. Alcohol can make you more dizzy, and increase flushing and rapid heartbeats. Avoid alcoholic drinks. Do not treat yourself for coughs, colds, or pain while you are taking this medicine without asking your doctor or health care professional for advice. Some ingredients may increase your blood pressure. What side effects may I notice from receiving this medicine? Side effects that you should report to your doctor or health care professional as soon as possible: -bluish discoloration of lips, fingernails, or palms of hands -irregular heartbeat, palpitations -low blood pressure -nausea, vomiting -persistent headache -unusually weak or tired Side effects that usually do not require medical attention (report to your doctor or health care professional if they  continue or are bothersome): -flushing of the face or neck -rash This list may not describe all possible side effects. Call your doctor for medical advice about side effects. You may report side effects to FDA at 1-800-FDA-1088. Where should I keep my medicine? Keep out of the reach of children. Store between 15 and 30 degrees C (59 and 86 degrees F). Keep container tightly closed. Throw away any unused medicine after the expiration date. NOTE: This sheet is a summary. It may not cover all possible information. If you have  questions about this medicine, talk to your doctor, pharmacist, or health care provider.  2018 Elsevier/Gold Standard (2013-07-24 14:48:19) Chest Wall Pain Chest wall pain is pain in or around the bones and muscles of your chest. Sometimes, an injury causes this pain. Sometimes, the cause may not be known. This pain may take several weeks or longer to get better. Follow these instructions at home: Pay attention to any changes in your symptoms. Take these actions to help with your pain:  Rest as told by your doctor.  Avoid activities that cause pain. Try not to use your chest, belly (abdominal), or side muscles to lift heavy things.  If directed, apply ice to the painful area: ? Put ice in a plastic bag. ? Place a towel between your skin and the bag. ? Leave the ice on for 20 minutes, 2-3 times per day.  Take over-the-counter and prescription medicines only as told by your doctor.  Do not use tobacco products, including cigarettes, chewing tobacco, and e-cigarettes. If you need help quitting, ask your doctor.  Keep all follow-up visits as told by your doctor. This is important.  Contact a doctor if:  You have a fever.  Your chest pain gets worse.  You have new symptoms. Get help right away if:  You feel sick to your stomach (nauseous) or you throw up (vomit).  You feel sweaty or light-headed.  You have a cough with phlegm (sputum) or you cough up blood.  You are short of breath. This information is not intended to replace advice given to you by your health care provider. Make sure you discuss any questions you have with your health care provider. Document Released: 03/12/2008 Document Revised: 03/01/2016 Document Reviewed: 12/20/2014 Elsevier Interactive Patient Education  [employer].

## 2018-04-10 NOTE — Progress Notes (Addendum)
Pharmacy Electrolyte Monitoring Consult:  Pharmacy consulted to assist in monitoring and replacing electrolytes in this 52 y.o. female admitted on 04/09/2018 with Chest Pain   Labs:  Sodium (mmol/L)  Date Value  04/10/2018 142   Potassium (mmol/L)  Date Value  04/10/2018 4.3   Magnesium (mg/dL)  Date Value  16/10/960407/12/2017 1.7   Calcium (mg/dL)  Date Value  54/09/811907/01/2018 8.9   Albumin (g/dL)  Date Value  14/78/295607/12/2017 4.4    Assessment/Plan: Patient admitted for CP w/ trop NG, EKG sinus tach. On admission labs K+ 2.5 Will replace via fluids NS + 40 mEq IV @ 75 ml/hr in addition to KCI 10 mEq IV x 4. Should theoretical increase K+ to 3.6 after 24 hours. Will monitor w/ am labs, mag pending  Patient's IV is burning, will discontinue K runs and switch to PO 40 mEq bid for 3 doses and continue fluids.  07/04 @ 0536 K 4.3 therapeutic. No further supplementation needed. Will d/c PO supplementation and f/u w/ am labs.  Thomasene Rippleavid Shelda Truby, PharmD, BCPS Clinical Pharmacist 04/10/2018

## 2018-04-10 NOTE — Progress Notes (Addendum)
Pt complaining about burning on the IV site where the potassium is running at. Page prime. Awaiting callback. Will continue to monitor.  Update 0146: Doctor Caryn BeeMaier called and ordered to discontinue the IV potassium run 1 x 4 and just switch it to PO. Pharmacy was notified and ordered 40 mEQ  tablet twice daily. Will continue to monitor.

## 2018-04-10 NOTE — Progress Notes (Signed)
SUBJECTIVE: patient denies any further chest pain or shortness of breath   Vitals:   04/09/18 2226 04/09/18 2345 04/10/18 0344 04/10/18 0730  BP: (!) 171/104 (!) 158/106 (!) 131/94 (!) 143/87  Pulse: 78 73 78 73  Resp: 18  18 14   Temp: 97.8 F (36.6 C)  98.2 F (36.8 C) 98.5 F (36.9 C)  TempSrc: Oral  Oral Oral  SpO2: 100%  98% 100%  Weight: 141 lb 8 oz (64.2 kg)  141 lb 4.8 oz (64.1 kg)   Height:        Intake/Output Summary (Last 24 hours) at 04/10/2018 1122 Last data filed at 04/10/2018 1036 Gross per 24 hour  Intake 2162.5 ml  Output 750 ml  Net 1412.5 ml    LABS: Basic Metabolic Panel: Recent Labs    04/09/18 1736 04/09/18 2338 04/10/18 0536  NA 138  --  142  K 2.5*  --  4.3  CL 105  --  111  CO2 17*  --  24  GLUCOSE 100*  --  84  BUN 7  --  5*  CREATININE 0.71  --  0.54  CALCIUM 9.5  --  8.9  MG  --  1.7  --    Liver Function Tests: Recent Labs    04/09/18 1736  AST 42*  ALT 31  ALKPHOS 99  BILITOT 1.0  PROT 7.5  ALBUMIN 4.4   No results for input(s): LIPASE, AMYLASE in the last 72 hours. CBC: Recent Labs    04/09/18 1736 04/10/18 0536  WBC 7.7 6.0  HGB 12.1 11.8*  HCT 34.3* 33.8*  MCV 94.9 95.9  PLT 338 293   Cardiac Enzymes: Recent Labs    04/09/18 1736 04/09/18 2338  TROPONINI <0.03 <0.03   BNP: Invalid input(s): POCBNP D-Dimer: No results for input(s): DDIMER in the last 72 hours. Hemoglobin A1C: No results for input(s): HGBA1C in the last 72 hours. Fasting Lipid Panel: No results for input(s): CHOL, HDL, LDLCALC, TRIG, CHOLHDL, LDLDIRECT in the last 72 hours. Thyroid Function Tests: No results for input(s): TSH, T4TOTAL, T3FREE, THYROIDAB in the last 72 hours.  Invalid input(s): FREET3 Anemia Panel: No results for input(s): VITAMINB12, FOLATE, FERRITIN, TIBC, IRON, RETICCTPCT in the last 72 hours.   PHYSICAL EXAM General: Well developed, well nourished, in no acute distress HEENT:  Normocephalic and atramatic Neck:   No JVD.  Lungs: Clear bilaterally to auscultation and percussion. Heart: HRRR . Normal S1 and S2 without gallops or murmurs.  Abdomen: Bowel sounds are positive, abdomen soft and non-tender  Msk:  Back normal, normal gait. Normal strength and tone for age. Extremities: No clubbing, cyanosis or edema.   Neuro: Alert and oriented X 3. Psych:  Good affect, responds appropriately  TELEMETRY: sinus rhythm  ASSESSMENT AND PLAN: hypokalemia has resolved and magnesium is being given because of hypomagnesemia and was found to be positive for cocaine as well as benzodiazepines which may have cause ordinary vasospasm leading to chest pain. She is feeling much better and can be discharged with follow-up Monday at 1 PM. Will do outpatient workup.  Active Problems:   Chest pain    Catalia Massett A, MD, [facility] 04/10/2018 11:22 AM

## 2018-04-12 LAB — HIV ANTIBODY (ROUTINE TESTING W REFLEX): HIV SCREEN 4TH GENERATION: NONREACTIVE

## 2018-06-13 ENCOUNTER — Encounter: Payer: Self-pay | Admitting: Emergency Medicine

## 2018-06-13 ENCOUNTER — Ambulatory Visit
Admission: EM | Admit: 2018-06-13 | Discharge: 2018-06-13 | Disposition: A | Discharge status: Home / Self Care | Payer: 59 | Attending: Physician Assistant | Admitting: Physician Assistant

## 2018-06-13 ENCOUNTER — Other Ambulatory Visit: Payer: Self-pay

## 2018-06-13 DIAGNOSIS — F411 Generalized anxiety disorder: Secondary | ICD-10-CM | POA: Diagnosis not present

## 2018-06-13 DIAGNOSIS — R0602 Shortness of breath: Secondary | ICD-10-CM | POA: Diagnosis not present

## 2018-06-13 DIAGNOSIS — I10 Essential (primary) hypertension: Secondary | ICD-10-CM | POA: Insufficient documentation

## 2018-06-13 DIAGNOSIS — Z825 Family history of asthma and other chronic lower respiratory diseases: Secondary | ICD-10-CM | POA: Insufficient documentation

## 2018-06-13 DIAGNOSIS — R42 Dizziness and giddiness: Secondary | ICD-10-CM | POA: Diagnosis not present

## 2018-06-13 DIAGNOSIS — Z7982 Long term (current) use of aspirin: Secondary | ICD-10-CM | POA: Insufficient documentation

## 2018-06-13 DIAGNOSIS — Z79899 Other long term (current) drug therapy: Secondary | ICD-10-CM | POA: Insufficient documentation

## 2018-06-13 DIAGNOSIS — Z8249 Family history of ischemic heart disease and other diseases of the circulatory system: Secondary | ICD-10-CM | POA: Insufficient documentation

## 2018-06-13 HISTORY — DX: Essential (primary) hypertension: I10

## 2018-06-13 HISTORY — DX: Hypokalemia: E87.6

## 2018-06-13 HISTORY — DX: Hypomagnesemia: E83.42

## 2018-06-13 LAB — URINALYSIS, COMPLETE (UACMP) WITH MICROSCOPIC
BILIRUBIN URINE: NEGATIVE
Bacteria, UA: NONE SEEN
Glucose, UA: NEGATIVE mg/dL
HGB URINE DIPSTICK: NEGATIVE
Ketones, ur: NEGATIVE mg/dL
NITRITE: NEGATIVE
PH: 7 (ref 5.0–8.0)
Protein, ur: NEGATIVE mg/dL
RBC / HPF: NONE SEEN RBC/hpf (ref 0–5)
SPECIFIC GRAVITY, URINE: 1.01 (ref 1.005–1.030)
Squamous Epithelial / LPF: NONE SEEN (ref 0–5)

## 2018-06-13 LAB — COMPREHENSIVE METABOLIC PANEL
ALK PHOS: 94 U/L (ref 38–126)
ALT: 28 U/L (ref 0–44)
AST: 36 U/L (ref 15–41)
Albumin: 4.8 g/dL (ref 3.5–5.0)
Anion gap: 14 (ref 5–15)
BILIRUBIN TOTAL: 0.8 mg/dL (ref 0.3–1.2)
BUN: 12 mg/dL (ref 6–20)
CALCIUM: 9.7 mg/dL (ref 8.9–10.3)
CHLORIDE: 101 mmol/L (ref 98–111)
CO2: 21 mmol/L — ABNORMAL LOW (ref 22–32)
CREATININE: 0.8 mg/dL (ref 0.44–1.00)
Glucose, Bld: 102 mg/dL — ABNORMAL HIGH (ref 70–99)
Potassium: 3.6 mmol/L (ref 3.5–5.1)
Sodium: 136 mmol/L (ref 135–145)
TOTAL PROTEIN: 8.3 g/dL — AB (ref 6.5–8.1)

## 2018-06-13 LAB — CBC WITH DIFFERENTIAL/PLATELET
Basophils Absolute: 0 10*3/uL (ref 0–0.1)
Basophils Relative: 0 %
EOS PCT: 0 %
Eosinophils Absolute: 0 10*3/uL (ref 0–0.7)
HCT: 39 % (ref 35.0–47.0)
Hemoglobin: 13.3 g/dL (ref 12.0–16.0)
LYMPHS PCT: 23 %
Lymphs Abs: 1.7 10*3/uL (ref 1.0–3.6)
MCH: 32.6 pg (ref 26.0–34.0)
MCHC: 34.1 g/dL (ref 32.0–36.0)
MCV: 95.5 fL (ref 80.0–100.0)
MONO ABS: 0.6 10*3/uL (ref 0.2–0.9)
MONOS PCT: 9 %
Neutro Abs: 5 10*3/uL (ref 1.4–6.5)
Neutrophils Relative %: 68 %
PLATELETS: 309 10*3/uL (ref 150–440)
RBC: 4.09 MIL/uL (ref 3.80–5.20)
RDW: 13.4 % (ref 11.5–14.5)
WBC: 7.4 10*3/uL (ref 3.6–11.0)

## 2018-06-13 LAB — TROPONIN I

## 2018-06-13 LAB — MAGNESIUM: Magnesium: 1.9 mg/dL (ref 1.7–2.4)

## 2018-06-13 MED ORDER — HYDROXYZINE HCL 25 MG PO TABS: 25.0000 mg | ORAL_TABLET | Freq: Four times a day (QID) | ORAL | 0 refills | Status: AC

## 2018-06-13 MED ORDER — HYDROCHLOROTHIAZIDE 25 MG PO TABS: 25.0000 mg | ORAL_TABLET | Freq: Every day | ORAL | 0 refills | Status: AC

## 2018-06-13 NOTE — ED Provider Notes (Addendum)
[facility]    CSN: [id number] Arrival date & time: 06/13/18  1843     History   Chief Complaint Chief Complaint  Patient presents with  . Shortness of Breath  . Dizziness    HPI Carmen Salazar is a 52 y.o. female. Patient presents with friend today for dizziness and shortness of breath that has been going on all day. Patient denies headaches, focal weakness, and chest pain. She does have a history of hypertension newly diagnosed and not yet controlled. She also admits to anxiety, hypomagnesemia, and hypokalemia. Patient currently taking Enalapril 5 mg daily. She states that she took 2 of her BP pills today and also double dosed her potassium and magnesium supplements. Patient has no history of heart problems. Patient reluctantly admits to consuming 3 beers per day and 3 mixed drinks per day at the minimum. She states that she has drank alcohol before presenting to the urgent care today. She has no other complaints or concerns.   HPI  Past Medical History:  Diagnosis Date  . Hypertension   . Hypokalemia   . Hypomagnesemia     Patient Active Problem List   Diagnosis Date Noted  . Chest pain 04/09/2018    History reviewed. No pertinent surgical history.  OB History   None      Home Medications    Prior to Admission medications   Medication Sig Start Date End Date Taking? Authorizing Provider  aspirin EC 81 MG EC tablet Take 1 tablet (81 mg total) by mouth daily. 04/10/18  Yes Wieting, Richard, MD  enalapril (VASOTEC) 5 MG tablet Take 5 mg by mouth daily. 05/18/18  Yes [provider]  Magnesium Oxide (MAG-OXIDE) 200 MG TABS Take 1 tablet (200 mg total) by mouth every morning. 04/10/18  Yes Wieting, Richard, MD  Potassium 99 MG TABS Take 1 tablet by mouth daily.   Yes [provider]  hydrochlorothiazide (HYDRODIURIL) 25 MG tablet Take 1 tablet (25 mg total) by mouth daily. 06/13/18 07/13/18  Eusebio Friendly B, PA-C  hydrOXYzine  (ATARAX/VISTARIL) 25 MG tablet Take 1 tablet (25 mg total) by mouth every 6 (six) hours for 15 days. 06/13/18 06/28/18  Eusebio Friendly B, PA-C  isosorbide mononitrate (IMDUR) 30 MG 24 hr tablet Take 1 tablet (30 mg total) by mouth daily. 04/10/18   Alford Highland, MD    Family History Family History  Problem Relation Age of Onset  . COPD Mother   . Hypertension Mother   . Hypertension Father     Social History Social History   Tobacco Use  . Smoking status: Never Smoker  . Smokeless tobacco: Never Used  Substance Use Topics  . Alcohol use: Yes    Alcohol/week: 42.0 standard drinks    Types: 21 Cans of beer, 21 Standard drinks or equivalent per week  . Drug use: Never     Allergies   Patient has no known allergies.   Review of Systems Review of Systems  Constitutional: Negative for appetite change, diaphoresis, fatigue and fever.  HENT: Negative for congestion, ear pain, sinus pressure, sinus pain and sore throat.   Eyes: Negative for photophobia and visual disturbance.  Respiratory: Positive for shortness of breath. Negative for apnea, cough, chest tightness and wheezing.   Cardiovascular: Positive for palpitations. Negative for chest pain and leg swelling.  Gastrointestinal: Negative for abdominal pain, constipation, diarrhea, nausea and vomiting.  Genitourinary: Negative for decreased urine volume, dysuria, frequency and urgency.  Musculoskeletal: Negative for back pain,  myalgias and neck pain.  Skin: Negative for rash.  Neurological: Positive for dizziness. Negative for seizures, syncope, facial asymmetry, speech difficulty, weakness, light-headedness, numbness and headaches.  Psychiatric/Behavioral: Negative for confusion and hallucinations. The patient is nervous/anxious.      Physical Exam Triage Vital Signs ED Triage Vitals  Enc Vitals Group     BP 06/13/18 1856 (!) 188/105     Pulse Rate 06/13/18 1856 92     Resp 06/13/18 1856 16     Temp --      Temp src --       SpO2 06/13/18 1856 100 %     Weight 06/13/18 1855 140 lb (63.5 kg)     Height 06/13/18 1855 5\' 6"  (1.676 m)     Head Circumference --      Peak Flow --      Pain Score 06/13/18 1854 0     Pain Loc --      Pain Edu? --      Excl. in GC? --    No data found.  Updated Vital Signs BP (!) 159/90 (BP Location: Right Arm)   Pulse 92   Temp 98.2 F (36.8 C) (Oral)   Resp 16   Ht 5\' 6"  (1.676 m)   Wt 140 lb (63.5 kg)   LMP 02/12/2018 (Exact Date)   SpO2 100%   BMI 22.60 kg/m      Physical Exam  Constitutional: She is oriented to person, place, and time. She appears well-developed and well-nourished.  Non-toxic appearance. She does not appear ill. No distress.  HENT:  Head: Normocephalic and atraumatic.  Mouth/Throat: Oropharynx is clear and moist. No oropharyngeal exudate.  Eyes: Pupils are equal, round, and reactive to light. Conjunctivae and EOM are normal. No scleral icterus.  Neck: Normal range of motion. Neck supple.  Cardiovascular: Normal rate, regular rhythm, normal heart sounds and intact distal pulses.  No murmur heard. Pulmonary/Chest: Effort normal and breath sounds normal. No respiratory distress. She has no decreased breath sounds. She has no wheezes.  Abdominal: Soft. There is no tenderness.  Lymphadenopathy:    She has no cervical adenopathy.  Neurological: She is alert and oriented to person, place, and time. She displays normal reflexes. No cranial nerve deficit or sensory deficit. She exhibits normal muscle tone. Coordination normal.  Negative focal weakness, negative Romberg, negative Pronator drift  Skin: Skin is warm and dry. No erythema.  Psychiatric: She has a normal mood and affect. Her behavior is normal. Judgment and thought content normal.  She is somewhat anxious in the exam room     UC Treatments / Results  Labs (all labs ordered are listed, but only abnormal results are displayed) Labs Reviewed  COMPREHENSIVE METABOLIC PANEL - Abnormal;  Notable for the following components:      Result Value   CO2 21 (*)    Glucose, Bld 102 (*)    Total Protein 8.3 (*)    All other components within normal limits  URINALYSIS, COMPLETE (UACMP) WITH MICROSCOPIC - Abnormal; Notable for the following components:   Color, Urine STRAW (*)    Leukocytes, UA SMALL (*)    All other components within normal limits  TROPONIN I  CBC WITH DIFFERENTIAL/PLATELET  MAGNESIUM    EKG None  Radiology No results found.  Procedures ED EKG Date/Time: 06/13/2018 7:12 PM Performed by: Shirlee Latch, PA-C Authorized by: Shirlee Latch, PA-C   ECG reviewed by ED Physician in the absence of a cardiologist:  yes   Previous ECG:    Previous ECG:  Unavailable Interpretation:    Interpretation: normal   Rate:    ECG rate assessment: normal   Rhythm:    Rhythm: sinus rhythm   Ectopy:    Ectopy: none   QRS:    QRS axis:  Normal ST segments:    ST segments:  Normal T waves:    T waves: normal     (including critical care time)  Medications Ordered in UC Medications - No data to display  Initial Impression / Assessment and Plan / UC Course  I have reviewed the triage vital signs and the nursing notes.  Pertinent labs & imaging results that were available during my care of the patient were reviewed by me and considered in my medical decision making (see chart for details).  Clinical Course as of Jun 14 1958  Fri Jun 13, 2018  1912 ED EKG [LE]  1946 Troponin I: <0.03 [LE]  1946 Troponin I: <0.03 [LE]    Clinical Course User Index [LE] Shirlee Latch, PA-C   Patient presented with elevated BP and symptoms of dizziness. EKG  Normal. Physical exam normal other than obvious signs of anxiety. On exam, lungs CTA, heart RRR, and benign neurological exam. Troponin normal. Magnesium and potassium levels WNL. UA without signs of renal damage. BP came down significantly as patient relaxed in exam room. Explained to patient her symptoms likely due to  combination of uncontrolled hypertension and anxiety. She has a f/u with her PCP in 4 days and I advised her to keep the appointment.   Increased enalapril to 10 mg daily and added 25 mg HCTZ daily. Explained complications of severe HTN and the fact that she needs to have it more controlled through her PCP. Her alcohol use is significantly contributing to the hypertension and I advised she discuss how to safely stop drinking with her PCP.   Patient given Vistaril rx for anxiety. She denies SI/HI and hallucinations. Advised to also F/u with PCP about possible anxiety medication.   Discussed with patient all red flags to look out for and when to seek re-examination as well as when to call EMS and go to ER. Before discharge today, she stated that all her symptoms had gone away.    Final Clinical Impressions(s) / UC Diagnoses   Final diagnoses:  Essential hypertension  Dizziness  Anxiety state  Shortness of breath     Discharge Instructions     You have been seen in the clinic today for dizziness and shortness of breath. Your blood pressure when checked initially was stage 3 hypertension, but it did come down to a more acceptable level before sending you home. Your EKG was normal. Your cardiac enzymes were normal. Your CBC and CMP were normal and magnesium level also normal.   Your symptoms are likely due to your uncontrolled and severe hypertension. Increase dose of enalapril to 10 mg daily and begin daily HCTZ as prescribed. Purchase BP cuff, keep log and f/u with your PCP at your office visit in 4 days.   If BP goes above 180/110 and you have return of symptoms of dizziness, any chest pain, any shortness of breath, any severe headache, vision changes, numbness/weakness/tingling you should call EMS and get to ER ASAP.   Speak to your PCP about safely stopping alcohol use at your follow up as this can lead to many health problems including hypertension.   You believe you have some anxiety.  Take vistaril as needed for anxiety symptoms.   AT THIS TIME YOUR LABS AND EKG ARE NORMAL BUT THIS DOES NOT RULE OUT A CARDIAC EVENT. YOUR SYMPTOMS OF DIZZINESS HAVE RESOLVED AND BP HAS COME DOWN AT THIS TIME. IF YOUR SYMPTOMS RETURN YOU SHOULD GO TO ED FOR MONITORING AND SERIAL CARDIAC ENZYMES.    ED Prescriptions    Medication Sig Dispense Auth. Provider   hydrochlorothiazide (HYDRODIURIL) 25 MG tablet Take 1 tablet (25 mg total) by mouth daily. 30 tablet Eusebio Friendly B, PA-C   hydrOXYzine (ATARAX/VISTARIL) 25 MG tablet Take 1 tablet (25 mg total) by mouth every 6 (six) hours for 15 days. 60 tablet Shirlee Latch, PA-C     Controlled Substance Prescriptions [place] Controlled Substance Registry consulted? Not Applicable   Gareth Morgan 06/14/18 1959    Shirlee Latch, PA-C 06/14/18 Babette Relic

## 2018-06-13 NOTE — ED Triage Notes (Signed)
Patient in today c/o SOB, dizziness, weakness since earlier today. Patient denies CP, but states she feels her heart fluttering.

## 2018-06-13 NOTE — Discharge Instructions (Addendum)
You have been seen in the clinic today for dizziness and shortness of breath. Your blood pressure when checked initially was stage 3 hypertension, but it did come down to a more acceptable level before sending you home. Your EKG was normal. Your cardiac enzymes were normal. Your CBC and CMP were normal and magnesium level also normal.   Your symptoms are likely due to your uncontrolled and severe hypertension. Increase dose of enalapril to 10 mg daily and begin daily HCTZ as prescribed. Purchase BP cuff, keep log and f/u with your PCP at your office visit in 4 days.   If BP goes above 180/110 and you have return of symptoms of dizziness, any chest pain, any shortness of breath, any severe headache, vision changes, numbness/weakness/tingling you should call EMS and get to ER ASAP.   Speak to your PCP about safely stopping alcohol use at your follow up as this can lead to many health problems including hypertension.   You believe you have some anxiety. Take vistaril as needed for anxiety symptoms.   AT THIS TIME YOUR LABS AND EKG ARE NORMAL BUT THIS DOES NOT RULE OUT A CARDIAC EVENT. YOUR SYMPTOMS OF DIZZINESS HAVE RESOLVED AND BP HAS COME DOWN AT THIS TIME. IF YOUR SYMPTOMS RETURN YOU SHOULD GO TO ED FOR MONITORING AND SERIAL CARDIAC ENZYMES.

## 2019-05-08 ENCOUNTER — Other Ambulatory Visit: Payer: Self-pay

## 2019-05-08 ENCOUNTER — Ambulatory Visit
Admission: EM | Admit: 2019-05-08 | Discharge: 2019-05-08 | Disposition: A | Discharge status: Home / Self Care | Payer: 59 | Attending: Family Medicine | Admitting: Family Medicine

## 2019-05-08 DIAGNOSIS — H8302 Labyrinthitis, left ear: Secondary | ICD-10-CM

## 2019-05-08 MED ORDER — MECLIZINE HCL 25 MG PO TABS: 25.0000 mg | ORAL_TABLET | Freq: Three times a day (TID) | ORAL | 0 refills | Status: DC | PRN

## 2019-05-08 MED ORDER — PREDNISONE 10 MG (21) PO TBPK: ORAL_TABLET | ORAL | 0 refills | Status: DC

## 2019-05-08 NOTE — ED Provider Notes (Signed)
[facility]    CSN: [id number] Arrival date & time: 05/08/19  0086   History   Chief Complaint Chief Complaint  Patient presents with  . Tinnitus  . Dizziness   HPI  53 year old female presents with the above complaints.  Patient reports her symptoms started last night.  She reports ringing in the ears, left greater than right.  Associated dizziness, gait instability/disequilibrium, nausea.  No vomiting.  No known inciting factor.  No fever.  Seems to be slightly improved this morning.  No medications or interventions tried.  Dizziness worse with activity.  No other reported symptoms.  No other complaints at this time.  Hx reviewed as below. Past Medical History:  Diagnosis Date  . Hypertension   . Hypokalemia   . Hypomagnesemia   Anxiety Psoriasis - scalp GERD Hx of Kidney stone Hx of drug use Hx of alcohol abuse  Past Surgical History:  Procedure Laterality Date  . NO PAST SURGERIES     OB History   No obstetric history on file.    Home Medications    Prior to Admission medications   Medication Sig Start Date End Date Taking? Authorizing Provider  enalapril (VASOTEC) 5 MG tablet Take 5 mg by mouth daily. 05/18/18  Yes [provider]  hydrochlorothiazide (HYDRODIURIL) 25 MG tablet Take 1 tablet (25 mg total) by mouth daily. 06/13/18 05/08/19 Yes Danton Clap, PA-C  Magnesium Oxide (MAG-OXIDE) 200 MG TABS Take 1 tablet (200 mg total) by mouth every morning. 04/10/18  Yes Wieting, Richard, MD  Potassium 99 MG TABS Take 1 tablet by mouth daily.   Yes [provider]  meclizine (ANTIVERT) 25 MG tablet Take 1 tablet (25 mg total) by mouth 3 (three) times daily as needed for dizziness. 05/08/19   Coral Spikes, DO  predniSONE (STERAPRED UNI-PAK 21 TAB) 10 MG (21) TBPK tablet 6 tablets on day 1; decrease by 1 tablet daily until gone. 05/08/19   Coral Spikes, DO  isosorbide mononitrate (IMDUR) 30 MG 24 hr tablet Take 1 tablet (30 mg total) by  mouth daily. 04/10/18 05/08/19  Loletha Grayer, MD    Family History Family History  Problem Relation Age of Onset  . COPD Mother   . Hypertension Mother   . Hypertension Father     Social History Social History   Tobacco Use  . Smoking status: Never Smoker  . Smokeless tobacco: Never Used  Substance Use Topics  . Alcohol use: Yes    Alcohol/week: 42.0 standard drinks    Types: 21 Cans of beer, 21 Standard drinks or equivalent per week  . Drug use: Never     Allergies   Patient has no known allergies.   Review of Systems Review of Systems  Constitutional: Negative.   HENT: Positive for tinnitus.   Gastrointestinal: Positive for nausea.  Neurological: Positive for dizziness.   Physical Exam Triage Vital Signs ED Triage Vitals  Enc Vitals Group     BP 05/08/19 0940 132/85     Pulse Rate 05/08/19 0940 60     Resp 05/08/19 0940 17     Temp 05/08/19 0940 97.9 F (36.6 C)     Temp Source 05/08/19 0940 Oral     SpO2 05/08/19 0940 100 %     Weight 05/08/19 0938 148 lb (67.1 kg)     Height 05/08/19 0938 5\' 6"  (1.676 m)     Head Circumference --      Peak Flow --  Pain Score 05/08/19 0938 0     Pain Loc --      Pain Edu? --      Excl. in GC? --    Updated Vital Signs BP 132/85 (BP Location: Right Arm)   Pulse 60   Temp 97.9 F (36.6 C) (Oral)   Resp 17   Ht 5\' 6"  (1.676 m)   Wt 67.1 kg   SpO2 100%   BMI 23.89 kg/m   Visual Acuity Right Eye Distance:   Left Eye Distance:   Bilateral Distance:    Right Eye Near:   Left Eye Near:    Bilateral Near:     Physical Exam Vitals signs and nursing note reviewed.  Constitutional:      General: She is not in acute distress.    Appearance: Normal appearance.  HENT:     Head: Normocephalic and atraumatic.     Right Ear: Tympanic membrane normal.     Left Ear: Tympanic membrane normal.  Eyes:     General:        Right eye: No discharge.        Left eye: No discharge.     Conjunctiva/sclera:  Conjunctivae normal.  Cardiovascular:     Rate and Rhythm: Normal rate and regular rhythm.  Pulmonary:     Effort: Pulmonary effort is normal.     Breath sounds: Normal breath sounds. No wheezing, rhonchi or rales.  Neurological:     Mental Status: She is alert.  Psychiatric:        Mood and Affect: Mood normal.        Behavior: Behavior normal.    UC Treatments / Results  Labs (all labs ordered are listed, but only abnormal results are displayed) Labs Reviewed - No data to display  EKG   Radiology No results found.  Procedures Procedures (including critical care time)  Medications Ordered in UC Medications - No data to display  Initial Impression / Assessment and Plan / UC Course  I have reviewed the triage vital signs and the nursing notes.  Pertinent labs & imaging results that were available during my care of the patient were reviewed by me and considered in my medical decision making (see chart for details).    53 year old female presents with suspected labyrinthitis.  Meclizine and prednisone as directed.  Final Clinical Impressions(s) / UC Diagnoses   Final diagnoses:  Labyrinthitis of left ear   Discharge Instructions   None    ED Prescriptions    Medication Sig Dispense Auth. Provider   meclizine (ANTIVERT) 25 MG tablet Take 1 tablet (25 mg total) by mouth 3 (three) times daily as needed for dizziness. 30 tablet Caylynn Minchew G, DO   predniSONE (STERAPRED UNI-PAK 21 TAB) 10 MG (21) TBPK tablet 6 tablets on day 1; decrease by 1 tablet daily until gone. 21 tablet Tommie Samsook, Eliese Kerwood G, DO     Controlled Substance Prescriptions [place] Controlled Substance Registry consulted? Not Applicable   Tommie SamsCook, Dana Dorner G, DO 05/08/19 1009

## 2019-05-08 NOTE — ED Triage Notes (Signed)
Patient states that last night she started having some sinus pressure, started with ringing in the ear and mainly in left ear. Patient states that she has noticed some dizziness with this and is concerned this may be vertigo.

## 2019-06-09 ENCOUNTER — Other Ambulatory Visit: Payer: Self-pay | Admitting: Family Medicine

## 2019-06-09 DIAGNOSIS — Z1231 Encounter for screening mammogram for malignant neoplasm of breast: Secondary | ICD-10-CM

## 2019-06-22 ENCOUNTER — Other Ambulatory Visit: Payer: Self-pay

## 2019-06-22 ENCOUNTER — Ambulatory Visit
Admission: RE | Admit: 2019-06-22 | Discharge: 2019-06-22 | Disposition: A | Discharge status: Home / Self Care | Payer: 59 | Source: Ambulatory Visit | Attending: Family Medicine | Admitting: Family Medicine

## 2019-06-22 DIAGNOSIS — Z1231 Encounter for screening mammogram for malignant neoplasm of breast: Secondary | ICD-10-CM | POA: Insufficient documentation

## 2019-07-26 IMAGING — DX DG CHEST 1V PORT
1 series · 1 of 1 positions shown · non-contrast
Comparison: None.

CLINICAL DATA: Chest pain

EXAM:
PORTABLE CHEST 1 VIEW

[chest ap]
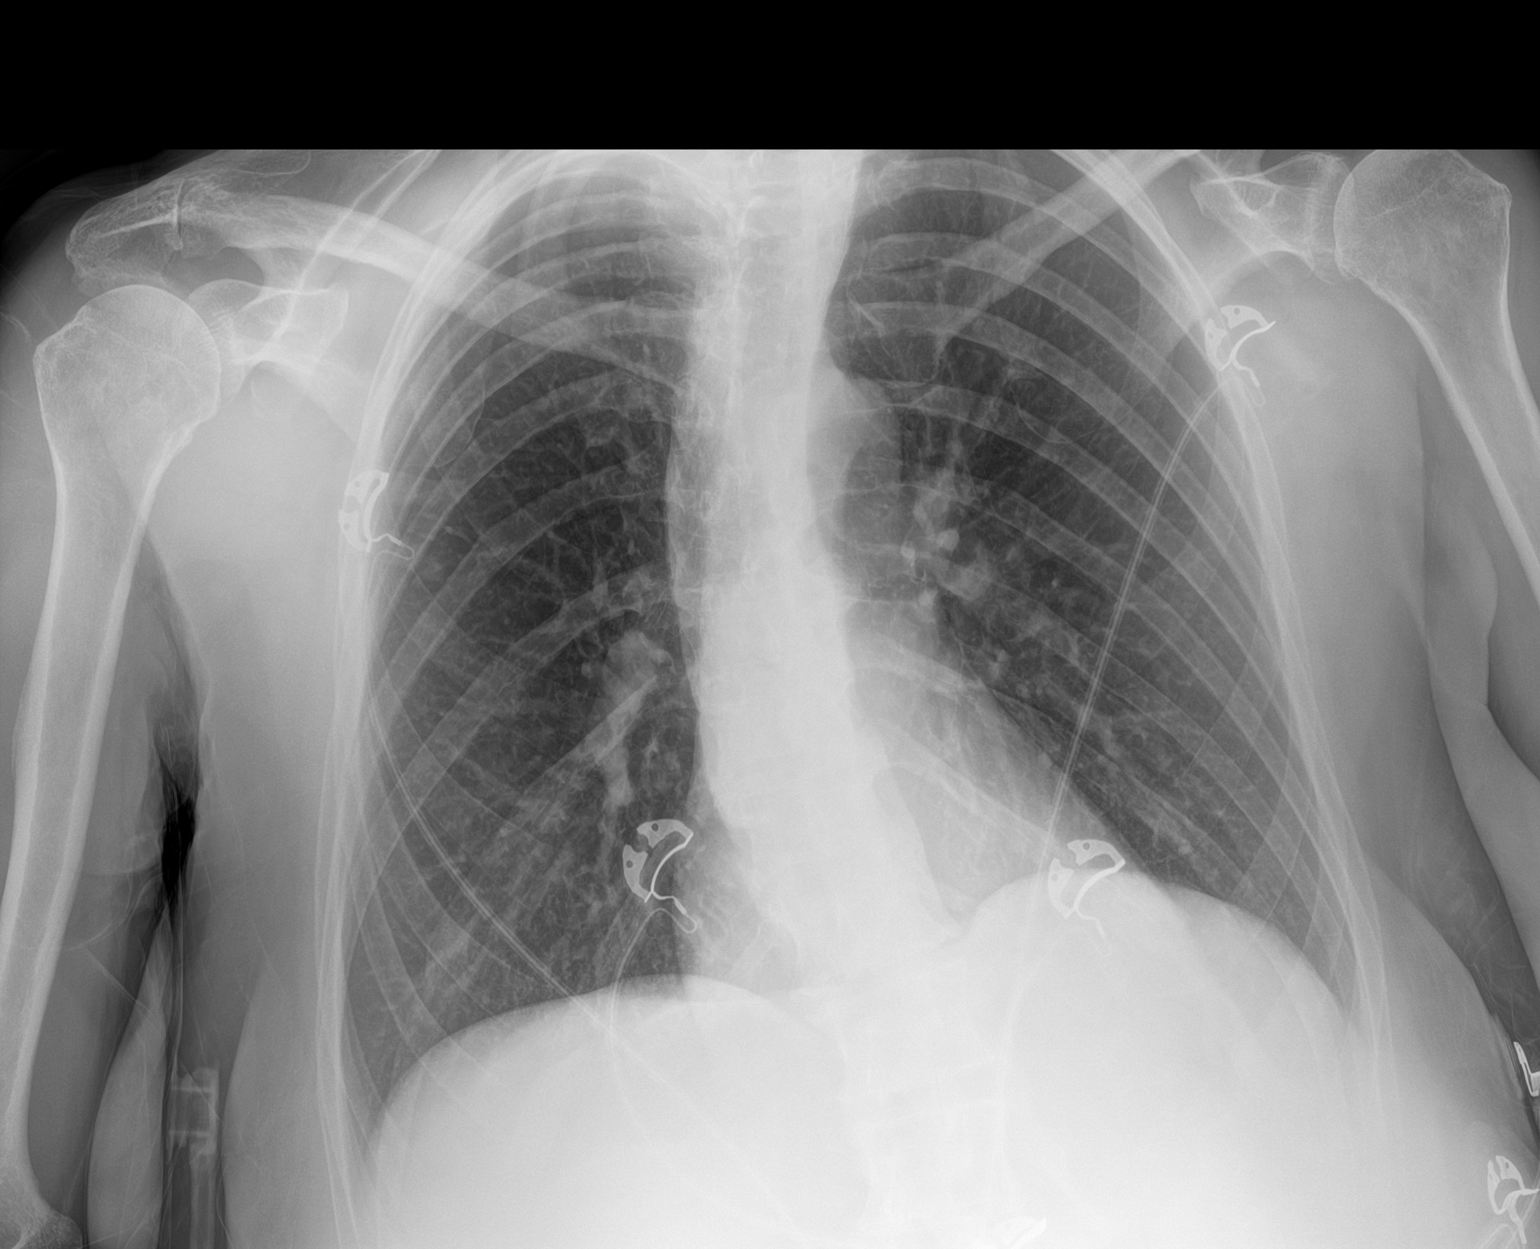

[1 of 1 positions shown; findings below may reference images not displayed]

FINDINGS: The heart size and mediastinal contours are within normal limits.
Both lungs are clear. Mild scoliosis of the spine.
IMPRESSION: No active disease.

## 2020-10-07 IMAGING — MG MM DIGITAL SCREENING BILAT W/ TOMO W/ CAD
8 series · 8 of 24 positions shown · non-contrast
Comparison: Previous exam(s).

CLINICAL DATA: Screening.

EXAM:
DIGITAL SCREENING BILATERAL MAMMOGRAM WITH TOMO AND CAD

[L CC synth-2D]
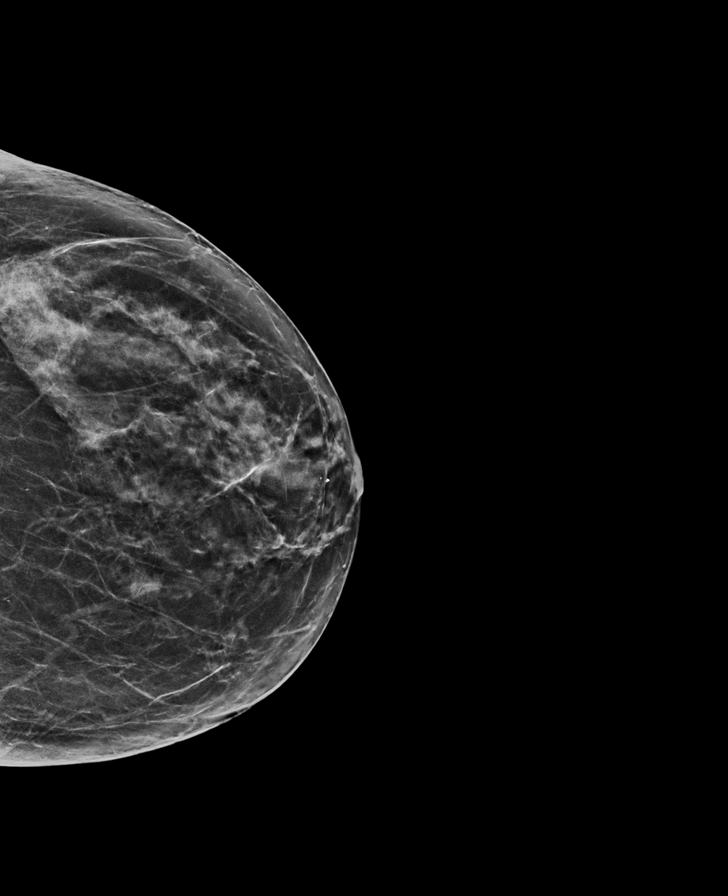

[R CC synth-2D]
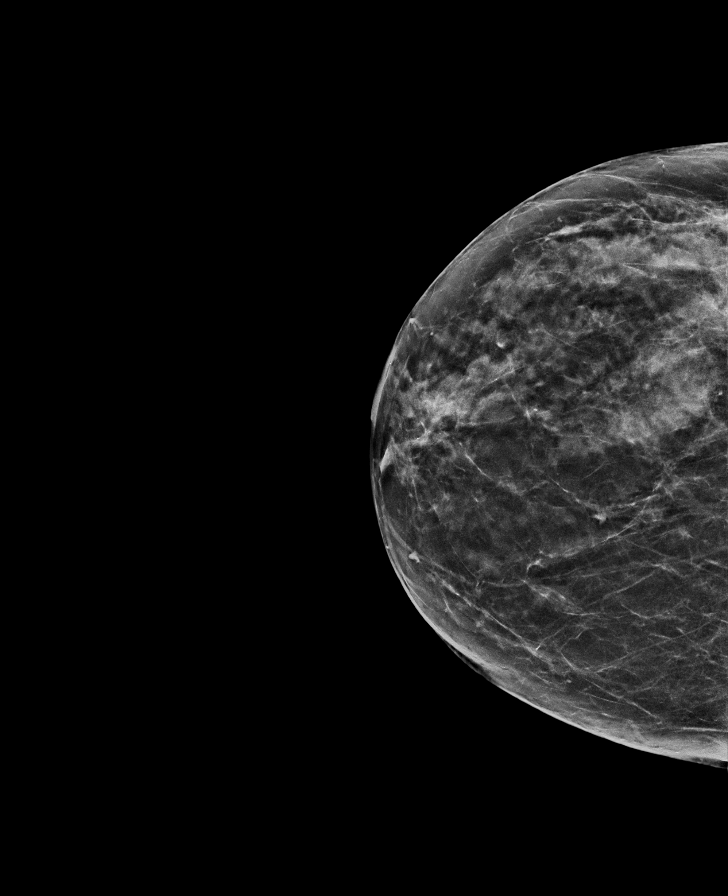

[L MLO synth-2D]
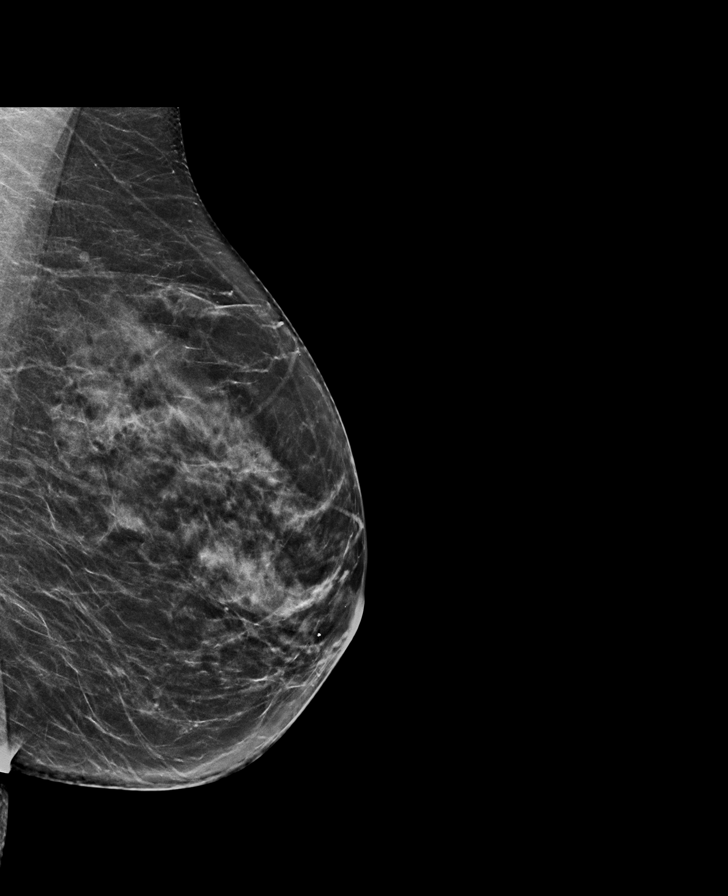

[R MLO synth-2D]
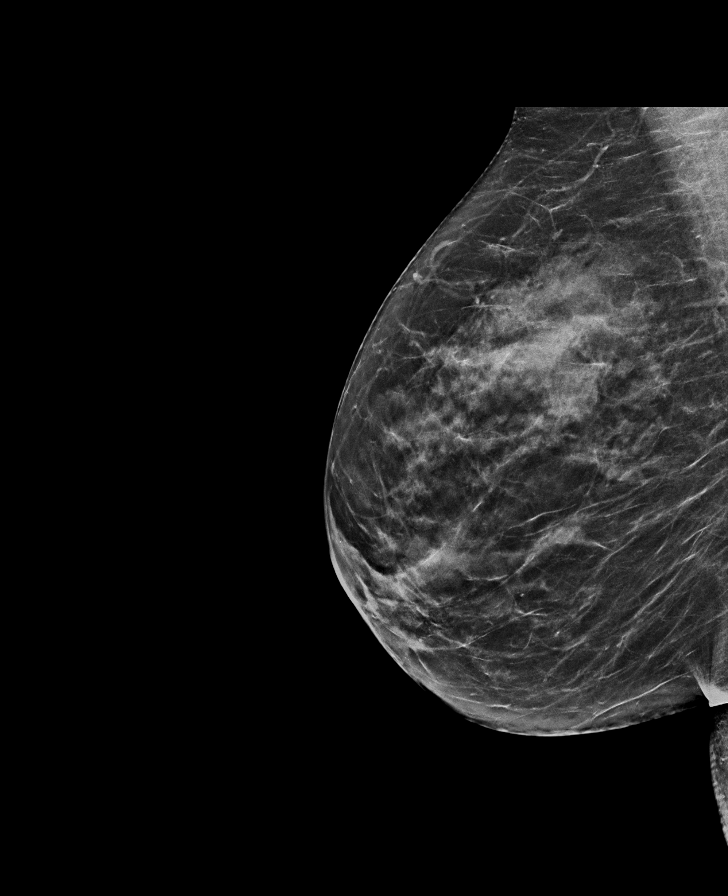

[R MLO tomo · tomo slice 32/63.0]
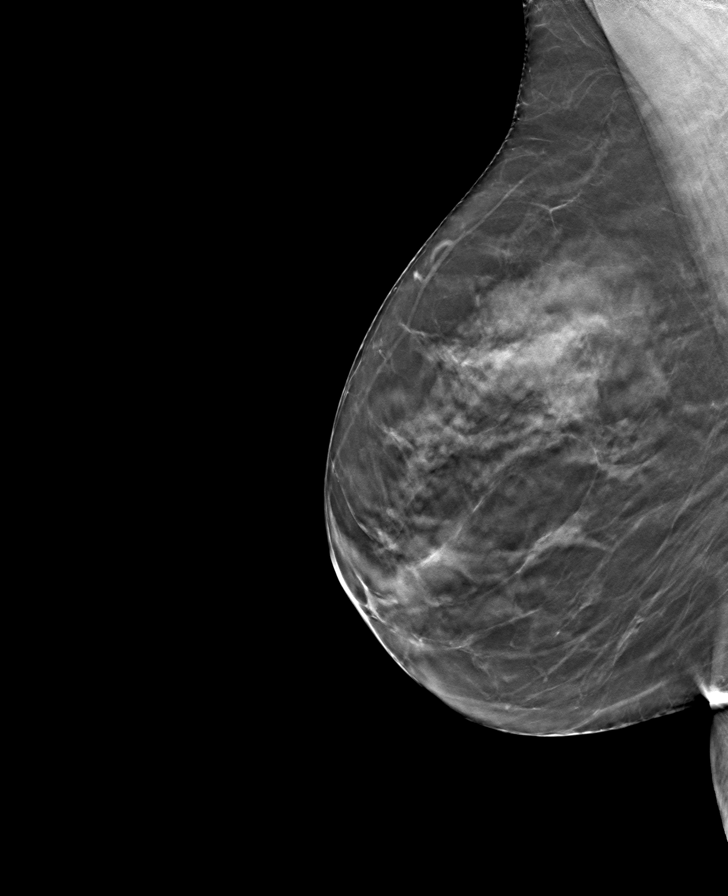

[R CC tomo · tomo slice 29/57.0]
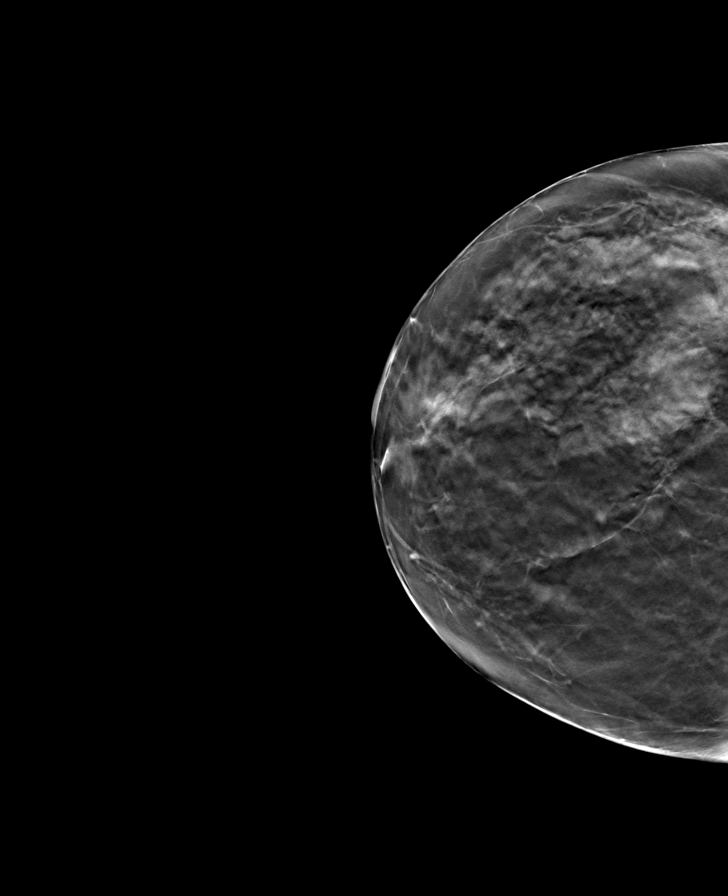

[L MLO tomo · tomo slice 33/64.0]
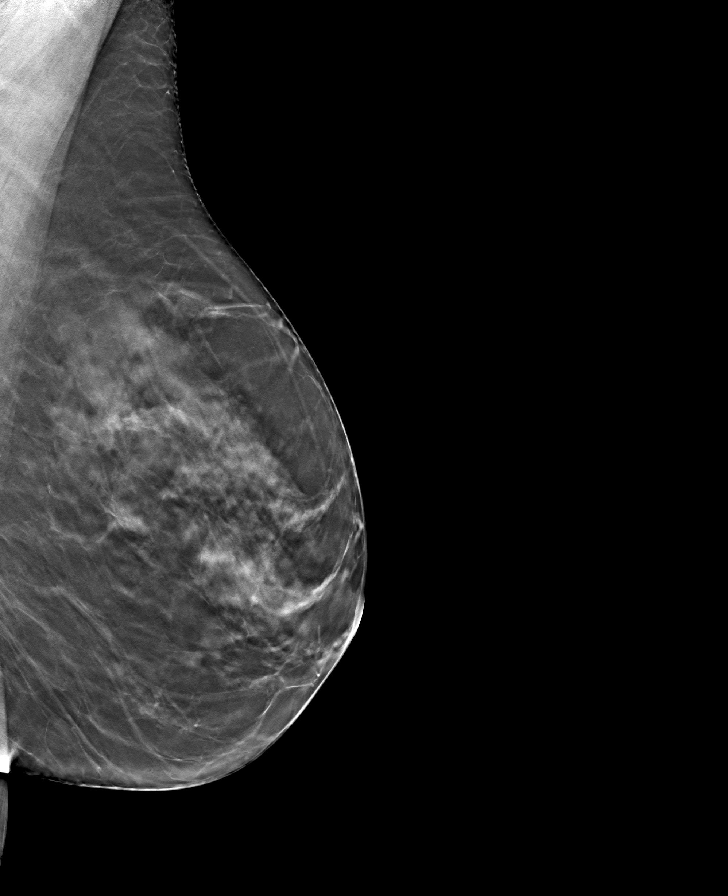

[L CC tomo · tomo slice 33/64.0]
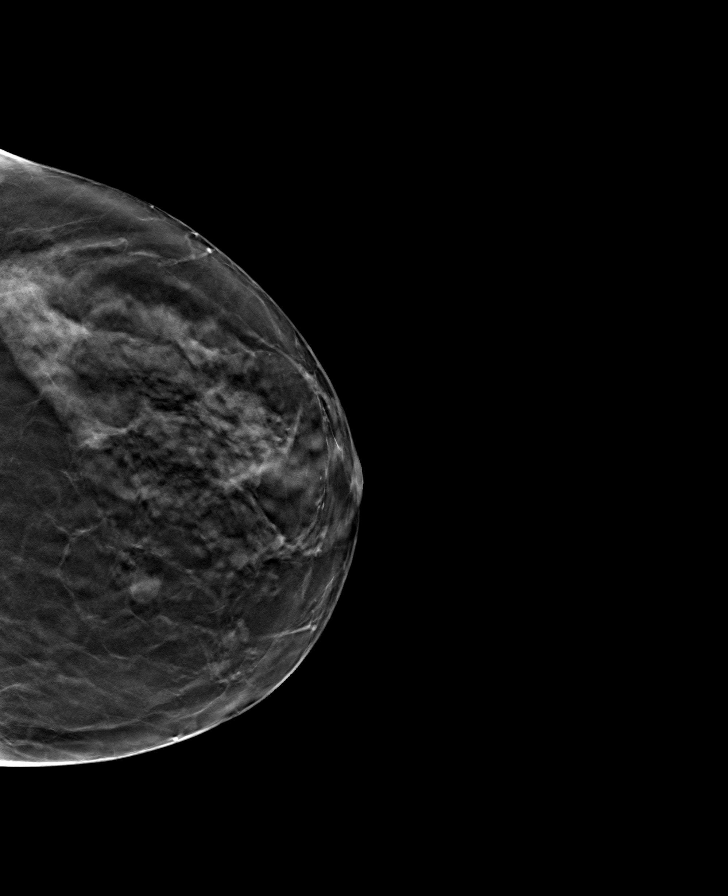

[8 of 24 positions shown; findings below may reference images not displayed]

ACR Breast Density Category c: The breast tissue is heterogeneously
dense, which may obscure small masses.
FINDINGS: There are no findings suspicious for malignancy. Images were
processed with CAD.
IMPRESSION: No mammographic evidence of malignancy. A result letter of this
screening mammogram will be mailed directly to the patient.

RECOMMENDATION:
Screening mammogram in one year. (Code:[id number]-U-LHB)

BI-RADS CATEGORY  1: Negative.

## 2021-05-10 ENCOUNTER — Encounter: Payer: Self-pay | Admitting: Emergency Medicine

## 2021-05-10 ENCOUNTER — Ambulatory Visit
Admission: EM | Admit: 2021-05-10 | Discharge: 2021-05-10 | Disposition: A | Discharge status: Home / Self Care | Payer: 59 | Attending: Emergency Medicine | Admitting: Emergency Medicine

## 2021-05-10 ENCOUNTER — Other Ambulatory Visit: Payer: Self-pay

## 2021-05-10 DIAGNOSIS — R42 Dizziness and giddiness: Secondary | ICD-10-CM | POA: Diagnosis present

## 2021-05-10 DIAGNOSIS — F411 Generalized anxiety disorder: Secondary | ICD-10-CM | POA: Diagnosis present

## 2021-05-10 DIAGNOSIS — R002 Palpitations: Secondary | ICD-10-CM | POA: Diagnosis not present

## 2021-05-10 LAB — CBC WITH DIFFERENTIAL/PLATELET
Abs Immature Granulocytes: 0.02 10*3/uL (ref 0.00–0.07)
Basophils Absolute: 0 10*3/uL (ref 0.0–0.1)
Basophils Relative: 1 %
Eosinophils Absolute: 0 10*3/uL (ref 0.0–0.5)
Eosinophils Relative: 1 %
HCT: 40 % (ref 36.0–46.0)
Hemoglobin: 14.1 g/dL (ref 12.0–15.0)
Immature Granulocytes: 0 %
Lymphocytes Relative: 25 %
Lymphs Abs: 1.8 10*3/uL (ref 0.7–4.0)
MCH: 32.2 pg (ref 26.0–34.0)
MCHC: 35.3 g/dL (ref 30.0–36.0)
MCV: 91.3 fL (ref 80.0–100.0)
Monocytes Absolute: 0.7 10*3/uL (ref 0.1–1.0)
Monocytes Relative: 10 %
Neutro Abs: 4.6 10*3/uL (ref 1.7–7.7)
Neutrophils Relative %: 63 %
Platelets: 385 10*3/uL (ref 150–400)
RBC: 4.38 MIL/uL (ref 3.87–5.11)
RDW: 12.4 % (ref 11.5–15.5)
WBC: 7.2 10*3/uL (ref 4.0–10.5)
nRBC: 0 % (ref 0.0–0.2)

## 2021-05-10 LAB — BASIC METABOLIC PANEL
Anion gap: 10 (ref 5–15)
BUN: 10 mg/dL (ref 6–20)
CO2: 29 mmol/L (ref 22–32)
Calcium: 10.7 mg/dL — ABNORMAL HIGH (ref 8.9–10.3)
Chloride: 97 mmol/L — ABNORMAL LOW (ref 98–111)
Creatinine, Ser: 0.78 mg/dL (ref 0.44–1.00)
GFR, Estimated: >60 mL/min (ref >60)
Glucose, Bld: 113 mg/dL — ABNORMAL HIGH (ref 70–99)
Potassium: 3.9 mmol/L (ref 3.5–5.1)
Sodium: 136 mmol/L (ref 135–145)

## 2021-05-10 LAB — MAGNESIUM: Magnesium: 2 mg/dL (ref 1.7–2.4)

## 2021-05-10 LAB — TSH: TSH: 2.251 u[IU]/mL (ref 0.350–4.500)

## 2021-05-10 NOTE — ED Triage Notes (Signed)
Pt c/o dizziness, shortness of breath, heart racing, chest tightness. She sates she was eating lunch and started having these symptoms. She states the symptoms have improved. She sates she had similar symptoms before she started her anxiety medications, but was much worse at that time. Denies n/v

## 2021-05-10 NOTE — ED Provider Notes (Signed)
HPI  SUBJECTIVE:  Carmen Salazar is a 55 y.o. female who presents with an episode of dizziness described as lightheadedness, palpitations described as her heart beating regularly, fast, and anxiety while eating lunch today.  Lasted about 5 to 10 minutes and then largely resolved.  She states that her heart rate reached 120s on her apple watch.  She denies a feeling of irregular heartbeat.  She states that she felt flushed, had some mild shortness of breath.  No chest pain, nausea, cold sweats, vomiting, cough, wheezing, abdominal pain, presyncope, syncope, tunnel vision, new or different tinnitus.  She tried breathing deeply with some improvement in her symptoms, symptoms became worse when she thought about what was happening.  No calf pain, swelling, surgery in the past 4 weeks, recent immobilization, hemoptysis, exogenous estrogen, history of PE, DVT.  She had 2 cups of coffee today, which is within normal limits for her.  Denies recent change in medications.  Denies recent illicit drug use, specifically cocaine.  She states that she had alcohol last night.  She has been in her usual state of health up until today.  she denies herbal supplement use or weight loss drugs.  No epistaxis, vaginal bleeding, melena, hematochezia.  She has a past medical history of anxiety, hypertension, hypokalemia, hypomagnesia and an "irregular heartbeat" that was evaluated by cardiology, which was ultimately attributed to menopause.  She states that she had similar symptoms before when she went through menopause 3 years ago.  Patient was admitted to the ED observation in July 2019 with chest pain, palpitations and anxiety to rule out ACS.  Patient states that the symptoms today are different from that episode.  No history of thyroid disease, SVT, atrial fibrillation, PVCs, PACs, cancer, diabetes, MI, coronary disease, hypercholesterolemia, smoking, anemia.  [id number], Britta Mccreedy, MD      Past Medical History:   Diagnosis Date   Hypertension    Hypokalemia    Hypomagnesemia     Past Surgical History:  Procedure Laterality Date   NO PAST SURGERIES      Family History  Problem Relation Age of Onset   COPD Mother    Hypertension Mother    Hypertension Father    Breast cancer Maternal Aunt        mat great aunt    Social History   Tobacco Use   Smoking status: Never   Smokeless tobacco: Never  Vaping Use   Vaping Use: Never used  Substance Use Topics   Alcohol use: Yes    Alcohol/week: 42.0 standard drinks    Types: 21 Cans of beer, 21 Standard drinks or equivalent per week   Drug use: Never    No current facility-administered medications for this encounter.  Current Outpatient Medications:    hydrochlorothiazide (HYDRODIURIL) 25 MG tablet, Take 1 tablet (25 mg total) by mouth daily., Disp: 30 tablet, Rfl: 0   lisinopril (ZESTRIL) 5 MG tablet, Take by mouth., Disp: , Rfl:    Magnesium Oxide (MAG-OXIDE) 200 MG TABS, Take 1 tablet (200 mg total) by mouth every morning., Disp: 30 tablet, Rfl: 0   Potassium 99 MG TABS, Take 1 tablet by mouth daily., Disp: , Rfl:    busPIRone (BUSPAR) 10 MG tablet, Take 10 mg by mouth 2 (two) times daily., Disp: , Rfl:    enalapril (VASOTEC) 5 MG tablet, Take 5 mg by mouth daily., Disp: , Rfl: 1  No Known Allergies   ROS  As noted in HPI.   Physical Exam  BP (!) 152/84 (BP Location: Right Arm)   Pulse 89   Temp 98.7 F (37.1 C) (Oral)   Resp 18   Ht 5\' 6"  (1.676 m)   Wt 67.1 kg   SpO2 100%   BMI 23.88 kg/m   Constitutional: Well developed, well nourished, no acute distress Eyes:  EOMI, conjunctiva normal bilaterally HENT: Normocephalic, atraumatic,mucus membranes moist Respiratory: Normal inspiratory effort, lungs clear bilaterally. Neck: Normal size, nontender thyroid Cardiovascular: Normal rate, regular rhythm, no murmurs rubs or gallops GI: nondistended skin: No rash, skin intact Musculoskeletal: Calves symmetric,  nontender, no edema. Neurologic: Alert & oriented x 3, no focal neuro deficits Psychiatric: Speech and behavior appropriate   ED Course   Medications - No data to display  Orders Placed This Encounter  Procedures   CBC with Differential    Standing Status:   Standing    Number of Occurrences:   1   Basic metabolic panel    Standing Status:   Standing    Number of Occurrences:   1   Magnesium    Standing Status:   Standing    Number of Occurrences:   1   TSH    Standing Status:   Standing    Number of Occurrences:   1   Ambulatory referral to Cardiology    Referral Priority:   Routine    Referral Type:   Consultation    Referral Reason:   Specialty Services Required    Requested Specialty:   Cardiology    Number of Visits Requested:   1   ED EKG    Standing Status:   Standing    Number of Occurrences:   1    Order Specific Question:   Reason for Exam    Answer:   Chest Pain    Results for orders placed or performed during the hospital encounter of 05/10/21 (from the past 24 hour(s))  CBC with Differential     Status: None   Collection Time: 05/10/21  2:38 PM  Result Value Ref Range   WBC 7.2 4.0 - 10.5 K/uL   RBC 4.38 3.87 - 5.11 MIL/uL   Hemoglobin 14.1 12.0 - 15.0 g/dL   HCT 07/10/21 45.6 - 25.6 %   MCV 91.3 80.0 - 100.0 fL   MCH 32.2 26.0 - 34.0 pg   MCHC 35.3 30.0 - 36.0 g/dL   RDW 38.9 37.3 - 42.8 %   Platelets 385 150 - 400 K/uL   nRBC 0.0 0.0 - 0.2 %   Neutrophils Relative % 63 %   Neutro Abs 4.6 1.7 - 7.7 K/uL   Lymphocytes Relative 25 %   Lymphs Abs 1.8 0.7 - 4.0 K/uL   Monocytes Relative 10 %   Monocytes Absolute 0.7 0.1 - 1.0 K/uL   Eosinophils Relative 1 %   Eosinophils Absolute 0.0 0.0 - 0.5 K/uL   Basophils Relative 1 %   Basophils Absolute 0.0 0.0 - 0.1 K/uL   Immature Granulocytes 0 %   Abs Immature Granulocytes 0.02 0.00 - 0.07 K/uL  Basic metabolic panel     Status: Abnormal   Collection Time: 05/10/21  2:38 PM  Result Value Ref Range    Sodium 136 135 - 145 mmol/L   Potassium 3.9 3.5 - 5.1 mmol/L   Chloride 97 (L) 98 - 111 mmol/L   CO2 29 22 - 32 mmol/L   Glucose, Bld 113 (H) 70 - 99 mg/dL   BUN 10 6 - 20 mg/dL  Creatinine, Ser 0.78 0.44 - 1.00 mg/dL   Calcium 17.5 (H) 8.9 - 10.3 mg/dL   GFR, Estimated >10 >25 mL/min   Anion gap 10 5 - 15  Magnesium     Status: None   Collection Time: 05/10/21  2:38 PM  Result Value Ref Range   Magnesium 2.0 1.7 - 2.4 mg/dL   No results found.  ED Clinical Impression  1. Palpitations   2. Dizziness   3. Anxiety state      ED Assessment/Plan  Previous records reviewed.  As noted in HPI.  EKG: Sinus rhythm, rate 74.  PAC with aberrant conduction.  No hypertrophy.  No ST or T wave changes.  No other change compared to EKG from September 2019.  Patient was having palpitations at the time EKG was obtained.  Patient with an episode of palpitations, dizziness described as lightheadedness accompanied with anxiety.  Denies vertigo.  States that she was symptomatic EKG was obtained, there is no evidence of ischemia on EKG.  She has a single PAC with aberrant conduction.  We will check a BMP, magnesium because of the history of hypomagnesia and hypokalemia although she states that she is taking her supplements, and a TSH.  Seriously doubt ACS, thus troponin was not drawn.  It would be too early for it to show up positive.  Doubt PE.  Wells score 0.  Her vitals are normal.   Labs reviewed.  CBC, BMP, magnesium level normal.  Unsure as to the patient's symptoms, but that does not appear to be a life-threatening issue today.  TSH is pending.  Will refer to cardiology for further evaluation.  ER return precautions given.   Discussed labs, MDM, treatment plan, and plan for follow-up with patient. Discussed sn/sx that should prompt return to the ED. patient agrees with plan.   No orders of the defined types were placed in this encounter.     *This clinic note was created using Dragon  dictation software. Therefore, there may be occasional mistakes despite careful proofreading.  ?    Domenick Gong, MD 05/10/21 [id number]

## 2021-05-10 NOTE — Discharge Instructions (Addendum)
Your labs were normal.  Your potassium and magnesium levels were normal.  You are not anemic.  Your EKG has had single PAC on it, but I am not concerned about a heart attack today.  I have put in a referral to cardiology for further evaluation.  Your TSH is pending.  We will contact you if it is abnormal.  Go Immediately to the emergency department if your symptoms return.

## 2023-08-16 ENCOUNTER — Other Ambulatory Visit: Payer: Self-pay | Admitting: Family Medicine

## 2023-08-16 DIAGNOSIS — Z1231 Encounter for screening mammogram for malignant neoplasm of breast: Secondary | ICD-10-CM
